# Patient Record
Sex: Male | Born: 1970 | Race: White | Hispanic: No | Marital: Single | Smoking: Current every day smoker
Health system: Southern US, Community
[De-identification: ages and names within clinical notes are randomized; demographics above are authoritative.]

## PROBLEM LIST (undated history)

## (undated) DIAGNOSIS — M1990 Unspecified osteoarthritis, unspecified site: Secondary | ICD-10-CM

## (undated) DIAGNOSIS — F4310 Post-traumatic stress disorder, unspecified: Secondary | ICD-10-CM

## (undated) DIAGNOSIS — F319 Bipolar disorder, unspecified: Secondary | ICD-10-CM

## (undated) DIAGNOSIS — M549 Dorsalgia, unspecified: Secondary | ICD-10-CM

## (undated) DIAGNOSIS — G8929 Other chronic pain: Secondary | ICD-10-CM

## (undated) HISTORY — PX: FRACTURE SURGERY: SHX138

---

## 2014-05-15 ENCOUNTER — Emergency Department (HOSPITAL_COMMUNITY)
Admission: EM | Admit: 2014-05-15 | Discharge: 2014-05-16 | Disposition: A | Discharge status: Home / Self Care | Payer: Self-pay | Attending: Emergency Medicine | Admitting: Emergency Medicine

## 2014-05-15 ENCOUNTER — Encounter (HOSPITAL_COMMUNITY): Payer: Self-pay | Admitting: Emergency Medicine

## 2014-05-15 DIAGNOSIS — Z720 Tobacco use: Secondary | ICD-10-CM | POA: Insufficient documentation

## 2014-05-15 DIAGNOSIS — K8020 Calculus of gallbladder without cholecystitis without obstruction: Secondary | ICD-10-CM | POA: Insufficient documentation

## 2014-05-15 DIAGNOSIS — R1011 Right upper quadrant pain: Secondary | ICD-10-CM

## 2014-05-15 DIAGNOSIS — G8929 Other chronic pain: Secondary | ICD-10-CM | POA: Insufficient documentation

## 2014-05-15 DIAGNOSIS — Z8739 Personal history of other diseases of the musculoskeletal system and connective tissue: Secondary | ICD-10-CM | POA: Insufficient documentation

## 2014-05-15 DIAGNOSIS — Z8659 Personal history of other mental and behavioral disorders: Secondary | ICD-10-CM | POA: Insufficient documentation

## 2014-05-15 HISTORY — DX: Other chronic pain: G89.29

## 2014-05-15 HISTORY — DX: Unspecified osteoarthritis, unspecified site: M19.90

## 2014-05-15 HISTORY — DX: Dorsalgia, unspecified: M54.9

## 2014-05-15 HISTORY — DX: Post-traumatic stress disorder, unspecified: F43.10

## 2014-05-15 HISTORY — DX: Bipolar disorder, unspecified: F31.9

## 2014-05-15 LAB — CBC WITH DIFFERENTIAL/PLATELET
BASOS ABS: 0 10*3/uL (ref 0.0–0.1)
BASOS PCT: 0 % (ref 0–1)
EOS PCT: 1 % (ref 0–5)
Eosinophils Absolute: 0.1 10*3/uL (ref 0.0–0.7)
HCT: 47.7 % (ref 39.0–52.0)
Hemoglobin: 16.8 g/dL (ref 13.0–17.0)
LYMPHS PCT: 19 % (ref 12–46)
Lymphs Abs: 1.6 10*3/uL (ref 0.7–4.0)
MCH: 30.3 pg (ref 26.0–34.0)
MCHC: 35.2 g/dL (ref 30.0–36.0)
MCV: 86.1 fL (ref 78.0–100.0)
Monocytes Absolute: 0.5 10*3/uL (ref 0.1–1.0)
Monocytes Relative: 6 % (ref 3–12)
Neutro Abs: 6.3 10*3/uL (ref 1.7–7.7)
Neutrophils Relative %: 74 % (ref 43–77)
PLATELETS: 166 10*3/uL (ref 150–400)
RBC: 5.54 MIL/uL (ref 4.22–5.81)
RDW: 13 % (ref 11.5–15.5)
WBC: 8.6 10*3/uL (ref 4.0–10.5)

## 2014-05-15 LAB — HEPATIC FUNCTION PANEL
ALK PHOS: 70 U/L (ref 39–117)
ALT: 49 U/L (ref 0–53)
AST: 33 U/L (ref 0–37)
Albumin: 4.3 g/dL (ref 3.5–5.2)
BILIRUBIN INDIRECT: 0.4 mg/dL (ref 0.3–0.9)
BILIRUBIN TOTAL: 0.5 mg/dL (ref 0.3–1.2)
Bilirubin, Direct: 0.1 mg/dL (ref 0.0–0.5)
Total Protein: 7.3 g/dL (ref 6.0–8.3)

## 2014-05-15 LAB — LIPASE, BLOOD: LIPASE: 39 U/L (ref 11–59)

## 2014-05-15 LAB — I-STAT CHEM 8, ED
BUN: 12 mg/dL (ref 6–23)
Calcium, Ion: 1.16 mmol/L (ref 1.12–1.23)
Chloride: 104 mmol/L (ref 96–112)
Creatinine, Ser: 1.1 mg/dL (ref 0.50–1.35)
Glucose, Bld: 96 mg/dL (ref 70–99)
HEMATOCRIT: 53 % — AB (ref 39.0–52.0)
Hemoglobin: 18 g/dL — ABNORMAL HIGH (ref 13.0–17.0)
Potassium: 3.7 mmol/L (ref 3.5–5.1)
SODIUM: 142 mmol/L (ref 135–145)
TCO2: 19 mmol/L (ref 0–100)

## 2014-05-15 MED ORDER — MORPHINE SULFATE 4 MG/ML IJ SOLN
4.0000 mg | Freq: Once | INTRAMUSCULAR | Status: AC
  Administered 2014-05-15: 4 mg via INTRAVENOUS
  Filled 2014-05-15: qty 1

## 2014-05-15 MED ORDER — ONDANSETRON HCL 4 MG/2ML IJ SOLN
4.0000 mg | Freq: Once | INTRAMUSCULAR | Status: AC
  Administered 2014-05-15: 4 mg via INTRAVENOUS
  Filled 2014-05-15: qty 2

## 2014-05-15 NOTE — ED Notes (Signed)
Pt c/o RUQ pain for past 24 hours, nausea without vomiting.; Loose bowels but no diarrhea

## 2014-05-15 NOTE — ED Provider Notes (Signed)
CSN: [id number]     Arrival date & time 05/15/14  1948 History  This chart was scribed for Joya Gaskinsonald W Ayren Zumbro, MD by Richarda Overlieichard Holland, ED Scribe. This patient was seen in room B16C/B16C and the patient's care was started 11:13 PM.   Chief Complaint  Patient presents with  . Abdominal Pain   Patient is a 44 y.o. male presenting with abdominal pain. The history is provided by the patient. No language interpreter was used.  Abdominal Pain Pain location:  RUQ Pain quality: aching and sharp   Pain radiates to:  Does not radiate Pain severity:  Mild Onset quality:  Unable to specify Duration:  24 hours Timing:  Unable to specify Associated symptoms: nausea   Associated symptoms: no diarrhea and no vomiting    HPI Comments: Justin Khan is a 44 y.o. male who presents to the Emergency Department complaining of constant, RUQ abdominal pain that started 24 hours ago. Pt describes the pain as poking and twisting. He reports associated nausea and says he has a metallic taste in his mouth. Pt reports that his pain at this time is a "dull ache." He says that when [place] told him his enzymes were elevated. He states that he had a small amount of alcohol last night. Pt says he uses ibuprofen occasionally for his scoliosis. He says the last time he ate was 2PM today. Pt denies any previous abdominal surgeries. He denies fever, CP, SOB, cough, diarrhea, blood in his stool and dysuria. Pt reports NKDA.   Past Medical History  Diagnosis Date  . Osteoarthritis   . Chronic back pain   . Bipolar 1 disorder   . PTSD (post-traumatic stress disorder)    Past Surgical History  Procedure Laterality Date  . Fracture surgery     No family history on file. History  Substance Use Topics  . Smoking status: Current Every Day Smoker  . Smokeless tobacco: Not on file  . Alcohol Use: Yes     Comment: occasional    Review of Systems  Gastrointestinal: Positive for nausea and  abdominal pain. Negative for vomiting, diarrhea and blood in stool.  All other systems reviewed and are negative.   Allergies  Review of patient's allergies indicates no known allergies.  Home Medications   Prior to Admission medications   Not on File   BP 133/81 mmHg  Pulse 77  Temp(Src) 97.7 F (36.5 C) (Oral)  Resp 16  Ht 6\' 1"  (1.854 m)  Wt 220 lb 9.6 oz (100.064 kg)  BMI 29.11 kg/m2  SpO2 98% Physical Exam  Nursing note and vitals reviewed.  CONSTITUTIONAL: Well developed/well nourished HEAD: Normocephalic/atraumatic EYES: EOMI/PERRL ENMT: Mucous membranes moist NECK: supple no meningeal signs SPINE/BACK:entire spine nontender CV: S1/S2 noted, no murmurs/rubs/gallops noted LUNGS: Lungs are clear to auscultation bilaterally, no apparent distress ABDOMEN: soft, moderate RUQ tenderness, no rebound or guarding, bowel sounds noted throughout abdomen GU:no cva tenderness NEURO: Pt is awake/alert/appropriate, moves all extremitiesx4.  No facial droop.   EXTREMITIES: pulses normal/equal, full ROM SKIN: warm, color normal PSYCH: no abnormalities of mood noted, alert and oriented to situation  ED Course  Procedures   DIAGNOSTIC STUDIES: Oxygen Saturation is 98% on RA, normal by my interpretation.    COORDINATION OF CARE: 11:15 PM Discussed treatment plan with pt at bedside and pt agreed to plan. 12:28 AM Pt feeling improved, awaiting US  At time of discharge, pt felt improved Advised need for surgery followup Discussed  appropriate dietary precautions for cholelithiasis   Labs Review Labs Reviewed  I-STAT CHEM 8, ED - Abnormal; Notable for the following:    Hemoglobin 18.0 (*)    HCT 53.0 (*)    All other components within normal limits  CBC WITH DIFFERENTIAL/PLATELET  HEPATIC FUNCTION PANEL  LIPASE, BLOOD    Imaging Review US Abdomen Complete  05/16/2014   CLINICAL DATA:  Acute onset of upper quadrant abdominal pain. Initial encounter.  EXAM: ULTRASOUND  ABDOMEN COMPLETE  COMPARISON:  None.  FINDINGS: Gallbladder: A single 1.6 cm nonmobile stone is noted at the base of the gallbladder. The gallbladder is otherwise unremarkable in appearance. There is no evidence for gallbladder wall thickening or pericholecystic fluid. No ultrasonographic Murphy's sign is elicited.  Common bile duct: Diameter: 0.4 cm, within normal limits.  Liver: No focal lesion identified. Parenchymal echogenicity within normal limits.  IVC: No abnormality visualized.  Pancreas: Visualized portion unremarkable.  Spleen: Size and appearance within normal limits.  Right Kidney: Length: 11.4 cm. Echogenicity within normal limits. No mass or hydronephrosis visualized.  Left Kidney: Length: 12.7 cm. Echogenicity within normal limits. No mass or hydronephrosis visualized.  Abdominal aorta: No aneurysm visualized.  Other findings: None.  IMPRESSION: 1. Cholelithiasis, with a single nonmobile stone at the base of the gallbladder; gallbladder otherwise unremarkable in appearance. 2. Otherwise unremarkable abdominal ultrasound.   Electronically Signed   By: Roanna Raider M.D.   On: 05/16/2014 02:49   Medications  morphine 4 MG/ML injection 4 mg (4 mg Intravenous Given 05/15/14 2333)  ondansetron (ZOFRAN) injection 4 mg (4 mg Intravenous Given 05/15/14 2333)  morphine 4 MG/ML injection 4 mg (4 mg Intravenous Given 05/16/14 0210)     MDM   Final diagnoses:  RUQ abdominal pain  Calculus of gallbladder without cholecystitis without obstruction    Nursing notes including past medical history and social history reviewed and considered in documentation Labs/vital reviewed myself and considered during evaluation   I personally performed the services described in this documentation, which was scribed in my presence. The recorded information has been reviewed and is accurate.      Joya Gaskins, MD 05/16/14 [phone]

## 2014-05-15 NOTE — ED Notes (Signed)
Dr. Wickline at bedside.  

## 2014-05-15 NOTE — ED Notes (Signed)
Pt c/o RUQ pain for the past 24 hours. Reports nausea, denies emesis. Pt also reports metallic taste in mouth. Tenderness noted to area

## 2014-05-16 ENCOUNTER — Emergency Department (HOSPITAL_COMMUNITY): Payer: Self-pay

## 2014-05-16 MED ORDER — OXYCODONE-ACETAMINOPHEN 5-325 MG PO TABS: 1.0000 | ORAL_TABLET | ORAL | Status: DC | PRN

## 2014-05-16 MED ORDER — MORPHINE SULFATE 4 MG/ML IJ SOLN
4.0000 mg | Freq: Once | INTRAMUSCULAR | Status: AC
  Administered 2014-05-16: 4 mg via INTRAVENOUS
  Filled 2014-05-16: qty 1

## 2014-05-16 NOTE — ED Notes (Signed)
US contacted for delay. Transporter enroute to get pt

## 2014-05-16 NOTE — Discharge Instructions (Signed)
Biliary Colic  °Biliary colic is a steady or irregular pain in the upper abdomen. It is usually under the right side of the rib cage. It happens when gallstones interfere with the normal flow of bile from the gallbladder. Bile is a liquid that helps to digest fats. Bile is made in the liver and stored in the gallbladder. When you eat a meal, bile passes from the gallbladder through the cystic duct and the common bile duct into the small intestine. There, it mixes with partially digested food. If a gallstone blocks either of these ducts, the normal flow of bile is blocked. The muscle cells in the bile duct contract forcefully to try to move the stone. This causes the pain of biliary colic.  °SYMPTOMS  °· A person with biliary colic usually complains of pain in the upper abdomen. This pain can be: °¨ In the center of the upper abdomen just below the breastbone. °¨ In the upper-right part of the abdomen, near the gallbladder and liver. °¨ Spread back toward the right shoulder blade. °· Nausea and vomiting. °· The pain usually occurs after eating. °· Biliary colic is usually triggered by the digestive system's demand for bile. The demand for bile is high after fatty meals. Symptoms can also occur when a person who has been fasting suddenly eats a very large meal. Most episodes of biliary colic pass after 1 to 5 hours. After the most intense pain passes, your abdomen may continue to ache mildly for about 24 hours. °DIAGNOSIS  °After you describe your symptoms, your caregiver will perform a physical exam. He or she will pay attention to the upper right portion of your belly (abdomen). This is the area of your liver and gallbladder. An ultrasound will help your caregiver look for gallstones. Specialized scans of the gallbladder may also be done. Blood tests may be done, especially if you have fever or if your pain persists. °PREVENTION  °Biliary colic can be prevented by controlling the risk factors for gallstones. Some of  these risk factors, such as heredity, increasing age, and pregnancy are a normal part of life. Obesity and a high-fat diet are risk factors you can change through a healthy lifestyle. Women going through menopause who take hormone replacement therapy (estrogen) are also more likely to develop biliary colic. °TREATMENT  °· Pain medication may be prescribed. °· You may be encouraged to eat a fat-free diet. °· If the first episode of biliary colic is severe, or episodes of colic keep retuning, surgery to remove the gallbladder (cholecystectomy) is usually recommended. This procedure can be done through small incisions using an instrument called a laparoscope. The procedure often requires a brief stay in the hospital. Some people can leave the hospital the same day. It is the most widely used treatment in people troubled by painful gallstones. It is effective and safe, with no complications in more than 90% of cases. °· If surgery cannot be done, medication that dissolves gallstones may be used. This medication is expensive and can take months or years to work. Only small stones will dissolve. °· Rarely, medication to dissolve gallstones is combined with a procedure called shock-wave lithotripsy. This procedure uses carefully aimed shock waves to break up gallstones. In many people treated with this procedure, gallstones form again within a few years. °PROGNOSIS  °If gallstones block your cystic duct or common bile duct, you are at risk for repeated episodes of biliary colic. There is also a 25% chance that you will develop   a gallbladder infection(acute cholecystitis), or some other complication of gallstones within 10 to 20 years. If you have surgery, schedule it at a time that is convenient for you and at a time when you are not sick. °HOME CARE INSTRUCTIONS  °· Drink plenty of clear fluids. °· Avoid fatty, greasy or fried foods, or any foods that make your pain worse. °· Take medications as directed. °SEEK MEDICAL  CARE IF:  °· You develop a fever over 100.5° F (38.1° C). °· Your pain gets worse over time. °· You develop nausea that prevents you from eating and drinking. °· You develop vomiting. °SEEK IMMEDIATE MEDICAL CARE IF:  °· You have continuous or severe belly (abdominal) pain which is not relieved with medications. °· You develop nausea and vomiting which is not relieved with medications. °· You have symptoms of biliary colic and you suddenly develop a fever and shaking chills. This may signal cholecystitis. Call your caregiver immediately. °· You develop a yellow color to your skin or the white part of your eyes (jaundice). °Document Released: 08/29/2005 Document Revised: 06/20/2011 Document Reviewed: 11/08/2007 °Exit[employer]® Patient Information ©2015 [employer]. This information is not intended to replace advice given to you by your health care provider. Make sure you discuss any questions you have with your health care provider. ° °

## 2014-07-08 ENCOUNTER — Emergency Department (HOSPITAL_COMMUNITY): Payer: Self-pay

## 2014-07-08 ENCOUNTER — Emergency Department (HOSPITAL_COMMUNITY)
Admission: EM | Admit: 2014-07-08 | Discharge: 2014-07-09 | Disposition: A | Discharge status: Home / Self Care | Payer: Self-pay | Attending: Emergency Medicine | Admitting: Emergency Medicine

## 2014-07-08 ENCOUNTER — Encounter (HOSPITAL_COMMUNITY): Payer: Self-pay | Admitting: Emergency Medicine

## 2014-07-08 DIAGNOSIS — K8020 Calculus of gallbladder without cholecystitis without obstruction: Secondary | ICD-10-CM | POA: Insufficient documentation

## 2014-07-08 DIAGNOSIS — Z8659 Personal history of other mental and behavioral disorders: Secondary | ICD-10-CM | POA: Insufficient documentation

## 2014-07-08 DIAGNOSIS — G8929 Other chronic pain: Secondary | ICD-10-CM | POA: Insufficient documentation

## 2014-07-08 DIAGNOSIS — Z720 Tobacco use: Secondary | ICD-10-CM | POA: Insufficient documentation

## 2014-07-08 DIAGNOSIS — Z8719 Personal history of other diseases of the digestive system: Secondary | ICD-10-CM

## 2014-07-08 DIAGNOSIS — M1990 Unspecified osteoarthritis, unspecified site: Secondary | ICD-10-CM | POA: Insufficient documentation

## 2014-07-08 DIAGNOSIS — R1011 Right upper quadrant pain: Secondary | ICD-10-CM

## 2014-07-08 LAB — CBC WITH DIFFERENTIAL/PLATELET
Basophils Absolute: 0 10*3/uL (ref 0.0–0.1)
Basophils Relative: 0 % (ref 0–1)
EOS ABS: 0.2 10*3/uL (ref 0.0–0.7)
EOS PCT: 2 % (ref 0–5)
HCT: 46 % (ref 39.0–52.0)
Hemoglobin: 16.1 g/dL (ref 13.0–17.0)
LYMPHS ABS: 2 10*3/uL (ref 0.7–4.0)
Lymphocytes Relative: 25 % (ref 12–46)
MCH: 30 pg (ref 26.0–34.0)
MCHC: 35 g/dL (ref 30.0–36.0)
MCV: 85.8 fL (ref 78.0–100.0)
MONO ABS: 0.6 10*3/uL (ref 0.1–1.0)
MONOS PCT: 8 % (ref 3–12)
Neutro Abs: 5.2 10*3/uL (ref 1.7–7.7)
Neutrophils Relative %: 65 % (ref 43–77)
Platelets: 164 10*3/uL (ref 150–400)
RBC: 5.36 MIL/uL (ref 4.22–5.81)
RDW: 13.2 % (ref 11.5–15.5)
WBC: 8.1 10*3/uL (ref 4.0–10.5)

## 2014-07-08 LAB — URINALYSIS, ROUTINE W REFLEX MICROSCOPIC
Bilirubin Urine: NEGATIVE
GLUCOSE, UA: NEGATIVE mg/dL
Hgb urine dipstick: NEGATIVE
Ketones, ur: NEGATIVE mg/dL
LEUKOCYTES UA: NEGATIVE
Nitrite: NEGATIVE
PH: 6 (ref 5.0–8.0)
Protein, ur: NEGATIVE mg/dL
Specific Gravity, Urine: 1.009 (ref 1.005–1.030)
Urobilinogen, UA: 0.2 mg/dL (ref 0.0–1.0)

## 2014-07-08 LAB — COMPREHENSIVE METABOLIC PANEL
ALK PHOS: 66 U/L (ref 39–117)
ALT: 31 U/L (ref 0–53)
AST: 22 U/L (ref 0–37)
Albumin: 4 g/dL (ref 3.5–5.2)
Anion gap: 6 (ref 5–15)
BUN: 13 mg/dL (ref 6–23)
CHLORIDE: 109 mmol/L (ref 96–112)
CO2: 26 mmol/L (ref 19–32)
Calcium: 9.3 mg/dL (ref 8.4–10.5)
Creatinine, Ser: 0.96 mg/dL (ref 0.50–1.35)
GFR calc non Af Amer: >90 mL/min (ref >90)
Glucose, Bld: 91 mg/dL (ref 70–99)
Potassium: 4.2 mmol/L (ref 3.5–5.1)
Sodium: 141 mmol/L (ref 135–145)
TOTAL PROTEIN: 6.8 g/dL (ref 6.0–8.3)
Total Bilirubin: 0.4 mg/dL (ref 0.3–1.2)

## 2014-07-08 LAB — LIPASE, BLOOD: Lipase: 44 U/L (ref 11–59)

## 2014-07-08 MED ORDER — ONDANSETRON HCL 4 MG/2ML IJ SOLN
4.0000 mg | Freq: Once | INTRAMUSCULAR | Status: AC
  Administered 2014-07-08: 4 mg via INTRAVENOUS
  Filled 2014-07-08: qty 2

## 2014-07-08 MED ORDER — MORPHINE SULFATE 4 MG/ML IJ SOLN
4.0000 mg | Freq: Once | INTRAMUSCULAR | Status: AC
  Administered 2014-07-08: 4 mg via INTRAVENOUS
  Filled 2014-07-08: qty 1

## 2014-07-08 MED ORDER — OXYCODONE-ACETAMINOPHEN 5-325 MG PO TABS: 1.0000 | ORAL_TABLET | Freq: Three times a day (TID) | ORAL | Status: DC | PRN

## 2014-07-08 MED ORDER — SODIUM CHLORIDE 0.9 % IV BOLUS (SEPSIS)
1000.0000 mL | Freq: Once | INTRAVENOUS | Status: AC
  Administered 2014-07-08: 1000 mL via INTRAVENOUS

## 2014-07-08 NOTE — ED Notes (Signed)
Report to kevin, rn.  Pt care transferred.

## 2014-07-08 NOTE — ED Notes (Signed)
Pt sts upper abd pain from hx of gallstones; pt sts feels same

## 2014-07-08 NOTE — ED Provider Notes (Signed)
CSN: [id number]     Arrival date & time 07/08/14  1814 History   First MD Initiated Contact with Patient 07/08/14 2017     Chief Complaint  Patient presents with  . Abdominal Pain     (Consider location/radiation/quality/duration/timing/severity/associated sxs/prior Treatment) The history is provided by the patient. No language interpreter was used.  Justin Khan is a 44 year old male with past history of bipolar disorder, PTSD, chronic back pain, osteoarthritis presenting to the ED with abdominal pain that started on Easter Sunday. Patient reported the pain was a sudden onset described as if he was "shot by a gun" localized to the right upper quadrant. Patient reported that the pain started approximately 2 hours after eating ham. Stated that it has been a constant throbbing intermittent stabbing pain. Reported associated nausea. Reported that at times the pain radiates towards his back and left upper quadrant. Stated that he's been using ibuprofen and Tylenol with minimal relief. Stated that he was seen and assessed in ED setting earlier this year where he was diagnosed with gallstones and has not followed up with anyone regarding this. Denied fever, chills, vomiting, diarrhea, melena, hematochezia, back pain, dysuria, decreased urination, dizziness, chest pain, shortness of breath, difficulty breathing, difficulty passing gas. PCP none  Past Medical History  Diagnosis Date  . Osteoarthritis   . Chronic back pain   . Bipolar 1 disorder   . PTSD (post-traumatic stress disorder)    Past Surgical History  Procedure Laterality Date  . Fracture surgery     History reviewed. No pertinent family history. History  Substance Use Topics  . Smoking status: Current Every Day Smoker  . Smokeless tobacco: Not on file  . Alcohol Use: Yes     Comment: occasional    Review of Systems  Constitutional: Negative for fever and chills.  Respiratory: Negative for chest tightness and shortness of  breath.   Cardiovascular: Negative for chest pain.  Gastrointestinal: Positive for nausea and abdominal pain. Negative for vomiting, diarrhea, constipation, blood in stool and anal bleeding.  Genitourinary: Negative for dysuria and decreased urine volume.  Musculoskeletal: Negative for back pain, neck pain and neck stiffness.      Allergies  Review of patient's allergies indicates no known allergies.  Home Medications   Prior to Admission medications   Medication Sig Start Date End Date Taking? Authorizing Provider  oxyCODONE-acetaminophen (PERCOCET/ROXICET) 5-325 MG per tablet Take 1 tablet by mouth every 8 (eight) hours as needed for severe pain. 07/08/14   Jamilett Ferrante, PA-C   BP 109/71 mmHg  Pulse 56  Temp(Src) 98.2 F (36.8 C) (Oral)  Resp 16  SpO2 92% Physical Exam  Constitutional: He is oriented to person, place, and time. He appears well-developed and well-nourished. No distress.  HENT:  Head: Normocephalic and atraumatic.  Eyes: Conjunctivae and EOM are normal. Pupils are equal, round, and reactive to light. Right eye exhibits no discharge. Left eye exhibits no discharge.  Neck: Normal range of motion. Neck supple. No tracheal deviation present.  Cardiovascular: Normal rate, regular rhythm and normal heart sounds.  Exam reveals no friction rub.   No murmur heard. Pulmonary/Chest: Effort normal and breath sounds normal. No respiratory distress. He has no wheezes. He has no rales.  Abdominal: Soft. Bowel sounds are normal. He exhibits no distension. There is tenderness in the right upper quadrant. There is positive Murphy's sign. There is no rebound and no guarding.  Musculoskeletal: Normal range of motion.  Lymphadenopathy:    He has no cervical  adenopathy.  Neurological: He is alert and oriented to person, place, and time. No cranial nerve deficit. He exhibits normal muscle tone. Coordination normal.  Skin: Skin is warm and dry. No rash noted. He is not diaphoretic.  No erythema.  Psychiatric: He has a normal mood and affect. His behavior is normal. Thought content normal.  Nursing note and vitals reviewed.   ED Course  Procedures (including critical care time)   CLINICAL DATA: Acute onset of upper quadrant abdominal pain. Initial encounter.  EXAM: ULTRASOUND ABDOMEN COMPLETE  COMPARISON: None.  FINDINGS: Gallbladder: A single 1.6 cm nonmobile stone is noted at the base of the gallbladder. The gallbladder is otherwise unremarkable in appearance. There is no evidence for gallbladder wall thickening or pericholecystic fluid. No ultrasonographic Murphy's sign is elicited.  Common bile duct: Diameter: 0.4 cm, within normal limits.  Liver: No focal lesion identified. Parenchymal echogenicity within normal limits.  IVC: No abnormality visualized.  Pancreas: Visualized portion unremarkable.  Spleen: Size and appearance within normal limits.  Right Kidney: Length: 11.4 cm. Echogenicity within normal limits. No mass or hydronephrosis visualized.  Left Kidney: Length: 12.7 cm. Echogenicity within normal limits. No mass or hydronephrosis visualized.  Abdominal aorta: No aneurysm visualized.  Other findings: None.  IMPRESSION: 1. Cholelithiasis, with a single nonmobile stone at the base of the gallbladder; gallbladder otherwise unremarkable in appearance. 2. Otherwise unremarkable abdominal ultrasound.   Electronically Signed  By: Roanna Raider M.D.  On: 05/16/2014 02:49   Results for orders placed or performed during the hospital encounter of 07/08/14  CBC with Differential  Result Value Ref Range   WBC 8.1 4.0 - 10.5 K/uL   RBC 5.36 4.22 - 5.81 MIL/uL   Hemoglobin 16.1 13.0 - 17.0 g/dL   HCT 40.9 81.1 - 91.4 %   MCV 85.8 78.0 - 100.0 fL   MCH 30.0 26.0 - 34.0 pg   MCHC 35.0 30.0 - 36.0 g/dL   RDW 78.2 95.6 - 21.3 %   Platelets 164 150 - 400 K/uL   Neutrophils Relative % 65 43 - 77 %   Neutro Abs 5.2  1.7 - 7.7 K/uL   Lymphocytes Relative 25 12 - 46 %   Lymphs Abs 2.0 0.7 - 4.0 K/uL   Monocytes Relative 8 3 - 12 %   Monocytes Absolute 0.6 0.1 - 1.0 K/uL   Eosinophils Relative 2 0 - 5 %   Eosinophils Absolute 0.2 0.0 - 0.7 K/uL   Basophils Relative 0 0 - 1 %   Basophils Absolute 0.0 0.0 - 0.1 K/uL  Comprehensive metabolic panel  Result Value Ref Range   Sodium 141 135 - 145 mmol/L   Potassium 4.2 3.5 - 5.1 mmol/L   Chloride 109 96 - 112 mmol/L   CO2 26 19 - 32 mmol/L   Glucose, Bld 91 70 - 99 mg/dL   BUN 13 6 - 23 mg/dL   Creatinine, Ser 0.86 0.50 - 1.35 mg/dL   Calcium 9.3 8.4 - 57.8 mg/dL   Total Protein 6.8 6.0 - 8.3 g/dL   Albumin 4.0 3.5 - 5.2 g/dL   AST 22 0 - 37 U/L   ALT 31 0 - 53 U/L   Alkaline Phosphatase 66 39 - 117 U/L   Total Bilirubin 0.4 0.3 - 1.2 mg/dL   GFR calc non Af Amer >90 >90 mL/min   GFR calc Af Amer >90 >90 mL/min   Anion gap 6 5 - 15  Lipase, blood  Result Value Ref Range  Lipase 44 11 - 59 U/L  Urinalysis, Routine w reflex microscopic  Result Value Ref Range   Color, Urine YELLOW YELLOW   APPearance HAZY (A) CLEAR   Specific Gravity, Urine 1.009 1.005 - 1.030   pH 6.0 5.0 - 8.0   Glucose, UA NEGATIVE NEGATIVE mg/dL   Hgb urine dipstick NEGATIVE NEGATIVE   Bilirubin Urine NEGATIVE NEGATIVE   Ketones, ur NEGATIVE NEGATIVE mg/dL   Protein, ur NEGATIVE NEGATIVE mg/dL   Urobilinogen, UA 0.2 0.0 - 1.0 mg/dL   Nitrite NEGATIVE NEGATIVE   Leukocytes, UA NEGATIVE NEGATIVE    Labs Review Labs Reviewed  URINALYSIS, ROUTINE W REFLEX MICROSCOPIC - Abnormal; Notable for the following:    APPearance HAZY (*)    All other components within normal limits  CBC WITH DIFFERENTIAL/PLATELET  COMPREHENSIVE METABOLIC PANEL  LIPASE, BLOOD    Imaging Review US Abdomen Limited Ruq  07/08/2014   CLINICAL DATA:  Acute onset of right upper quadrant abdominal pain. Initial encounter.  EXAM: US ABDOMEN LIMITED - RIGHT UPPER QUADRANT  COMPARISON:  Abdominal  ultrasound performed 05/16/2014  FINDINGS: Gallbladder:  A single 1.9 cm nonmobile stone is noted dependently in the gallbladder. The gallbladder is otherwise unremarkable in appearance. No gallbladder wall thickening or pericholecystic fluid is seen. No ultrasonographic Murphy's sign is elicited.  Common bile duct:  Diameter: 0.5 cm, within normal limits in caliber.  Liver:  No focal lesion identified. Within normal limits in parenchymal echogenicity.  IMPRESSION: Cholelithiasis; gallbladder otherwise unremarkable in appearance. No acute abnormality seen in the right upper quadrant.   Electronically Signed   By: Roanna Raider M.D.   On: 07/08/2014 22:41     EKG Interpretation None      MDM   Final diagnoses:  Calculus of gallbladder without cholecystitis without obstruction  Right upper quadrant pain    Medications  sodium chloride 0.9 % bolus 1,000 mL (0 mLs Intravenous Stopped 07/08/14 2320)  morphine 4 MG/ML injection 4 mg (4 mg Intravenous Given 07/08/14 2147)  ondansetron (ZOFRAN) injection 4 mg (4 mg Intravenous Given 07/08/14 2147)  morphine 4 MG/ML injection 4 mg (4 mg Intravenous Given 07/08/14 2340)    Filed Vitals:   07/08/14 2300 07/08/14 2319 07/08/14 2330 07/09/14 0000  BP: 129/79 129/79 115/82 109/71  Pulse: 67 60 70 56  Temp:      TempSrc:      Resp:  16    SpO2: 93% 97% 96% 92%   This provider reviewed the patient's chart. Patient was seen and assessed in ED setting on 05/15/2014 regarding right upper quadrant pain. Patient was diagnosed with cholelithiasis, single non-mobile stone noted at the base of the gallbladder.  CBC negative elevated leukocytosis. Hemoglobin 16.1, hematocrit 46.0. CMP unremarkable-negative elevated liver enzymes. Negative elevated bilirubin. Lipase negative elevation. Urinalysis negative for hemoglobin, nitrites, leukocytes-negative findings of infection. Abdominal ultrasound noted cholelithiasis - gallbladder otherwise unremarkable-no findings  of acute abnormalities of the right upper quadrant. [place] negative for acute cholecystitis or cholangitis. Pain controlled in the ED setting with IV medications. Negative findings of infection. Patient stable, afebrile. Patient not septic appearing. Negative signs of respiratory distress. Discharged patient. [facility] [place] [facility]. Discussed with patient to rest and stay hydrated. Discussed with patient proper diet. Discussed with patient to closely monitor symptoms and if symptoms are to worsen or change to report back to the ED - strict return instructions given.  Patient agreed to plan of care, understood, all questions  answered.   Raymon Mutton, PA-C 07/09/14 1610  Doug Sou, MD 07/09/14 [phone]

## 2014-07-09 NOTE — Discharge Instructions (Signed)
Please call your doctor for a followup appointment within 24-48 hours. When you talk to your doctor please let them know that you were seen in the emergency department and have them acquire all of your records so that they can discuss the findings with you and formulate a treatment plan to fully care for your new and ongoing problems. Please call and set-up an appointment with [facility]  Please call and set-up an appointment with [facility]  Please rest and stay hydrated Please avoid food that is high in fat, grease, oil, cholesterol  Please drink plenty of water Please take medications as prescribed - while on pain medications there is to be no drinking alcohol, driving, operating any heavy machinery. If extra please dispose in a proper manner. Please do not take any extra Tylenol with this medication for this can lead to Tylenol overdose and liver issues.  Please continue to monitor symptoms closely and if symptoms are to worsen or change (fever greater than 101, chills, sweating, nausea, vomiting, chest pain, shortness of breathe, difficulty breathing, weakness, numbness, tingling, worsening or changes to pain pattern, worsening or changes to right upper quadrant pain, inability keep food or fluids down, blood in stools, black tarry stools, back pain, changes to skin colored such as yellowing, altered mental status, changes to mentation, confusion, disorientation) please report back to the Emergency Department immediately.    Cholelithiasis Cholelithiasis (also called gallstones) is a form of gallbladder disease in which gallstones form in your gallbladder. The gallbladder is an organ that stores bile made in the liver, which helps digest fats. Gallstones begin as small crystals and slowly grow into stones. Gallstone pain occurs when the gallbladder spasms and a gallstone is blocking the duct. Pain can also occur when a stone passes out of the duct.  RISK  FACTORS  Being male.   Having multiple pregnancies. Health care providers sometimes advise removing diseased gallbladders before future pregnancies.   Being obese.  Eating a diet heavy in fried foods and fat.   Being older than 60 years and increasing age.   Prolonged use of medicines containing male hormones.   Having diabetes mellitus.   Rapidly losing weight.   Having a family history of gallstones (heredity).  SYMPTOMS  Nausea.   Vomiting.  Abdominal pain.   Yellowing of the skin (jaundice).   Sudden pain. It may persist from several minutes to several hours.  Fever.   Tenderness to the touch. In some cases, when gallstones do not move into the bile duct, people have no pain or symptoms. These are called "silent" gallstones.  TREATMENT Silent gallstones do not need treatment. In severe cases, emergency surgery may be required. Options for treatment include:  Surgery to remove the gallbladder. This is the most common treatment.  Medicines. These do not always work and may take 6-12 months or more to work.  Shock wave treatment (extracorporeal biliary lithotripsy). In this treatment an ultrasound machine sends shock waves to the gallbladder to break gallstones into smaller pieces that can pass into the intestines or be dissolved by medicine. HOME CARE INSTRUCTIONS   Only take over-the-counter or prescription medicines for pain, discomfort, or fever as directed by your health care provider.   Follow a low-fat diet until seen again by your health care provider. Fat causes the gallbladder to contract, which can result in pain.   Follow up with your health care provider as directed. Attacks are almost always recurrent and surgery is usually  required for permanent treatment.  SEEK IMMEDIATE MEDICAL CARE IF:   Your pain increases and is not controlled by medicines.   You have a fever or persistent symptoms for more than 2-3 days.   You have a  fever and your symptoms suddenly get worse.   You have persistent nausea and vomiting.  MAKE SURE YOU:   Understand these instructions.  Will watch your condition.  Will get help right away if you are not doing well or get worse. Document Released: 03/24/2005 Document Revised: 11/28/2012 Document Reviewed: 09/19/2012 [facility] Patient Information 2015 [place], [place]. This information is not intended to replace advice given to you by your health care provider. Make sure you discuss any questions you have with your health care provider.   Emergency Department Resource Guide 1) Find a Doctor and Pay Out of Pocket Although you won't have to find out who is covered by your insurance plan, it is a good idea to ask around and get recommendations. You will then need to call the office and see if the doctor you have chosen will accept you as a new patient and what types of options they offer for patients who are self-pay. Some doctors offer discounts or will set up payment plans for their patients who do not have insurance, but you will need to ask so you aren't surprised when you get to your appointment.  2) Contact Your Local Health Department Not all health departments have doctors that can see patients for sick visits, but many do, so it is worth a call to see if yours does. If you don't know where your local health department is, you can check in your phone book. The CDC also has a tool to help you locate your state's health department, and many state websites also have listings of all of their local health departments.  3) Find a Walk-in Clinic If your illness is not likely to be very severe or complicated, you may want to try a walk in clinic. These are popping up all over the country in pharmacies, drugstores, and shopping centers. They're usually staffed by nurse practitioners or physician assistants that have been trained to treat common illnesses and complaints. They're usually fairly quick  and inexpensive. However, if you have serious medical issues or chronic medical problems, these are probably not your best option.  No Primary Care Doctor: - Call Health Connect at  [phone] - they can help you locate a primary care doctor that  accepts your insurance, provides certain services, etc. - Physician Referral Service- [phone]  Chronic Pain Problems: Organization         Address  Phone   Notes  [facility]  [phone] Patients need to be referred by their primary care doctor.   Medication Assistance: Organization         Address  Phone   Notes  [facility] [address] [place], [place] [address] [phone] --Must be a resident of [facility] -- Must have NO insurance coverage whatsoever (no Medicaid/ Medicare, etc.) -- The pt. MUST have a primary care doctor that directs their care regularly and follows them in the community   [facility]  [phone]   [employer]  [phone]    Agencies that provide inexpensive medical care: Organization         Address  Phone   Notes  [facility]  [phone]   [facility]    [phone]   [facility] [address] [place], [place] [id number] [phone]) [phone]   [facility] [address], [place] [phone]   [facility]anned Parenthood    [phone]) [phone]   [facility]    [phone]) [phone]   [facility]  [address], [place] Phone:  [phone], Fax:  [phone] Hours of Operation:  9 am - 6 pm, M-F.  Also accepts Medicaid/Medicare and self-pay.  [facility]  [address], [place] Phone: [phone](336) [phone], Fax: [phone]) [phone]. Hours of Operation:  8:30 am - 5:30 pm, M-F.  Also accepts Medicaid and self-pay.  [facility] [place] [address], [place] Phone: [phone](336) [phone]    [facility] [address], [place], [place] [phone], Ext. [phone] Mondays & Thursdays: 7-9 AM.  First 15 patients are seen on a first come, first serve basis.    Medicaid-accepting [facility] Providers:  Organization         Address  Phone   Notes  [facility] [address], [place] [phone]) [phone] Also accepts self-pay patients.  [facility] [address], [place]  [phone]) [phone]   [facility] [address], [place] [phone]) [phone]   [facility] [address] [place] [address], [place] [phone]) [phone]   Renaye RakersVeita Bland [address], [place]   [phone] Only accepts [place] Access [place] patients after they have their name applied to their card.   Self-Pay (no insurance) in [facility]:  Organization         Address  Phone   Notes  Sickle Cell Patients, [facility] [address], [place] [phone]) [phone]   [facility] [address], [place] [phone]) [phone]   [facility]  [address], [place] [phone]   [facility]/Dr. Osei-Bonsu  [address] [place] [address], [place] or [id number], [place] [phone]) [phone] Phone number for both [place] and [place] locations is the same.  [facility] [address], [place] [phone]) [phone]   [facility] [address] [place] [address] [place] [address], [place] or [address] [phone]   [facility] [address], [place] [phone]) [phone], phone; [phone], fax Sees patients 1st and 3rd Saturday of every month.  Must not qualify for public or private insurance (i.e. Medicaid, Medicare, [place] Health Choice, Veterans' Benefits)  Household income should be no more than 200% of the poverty level The clinic cannot treat you if you are  pregnant or think you are pregnant  Sexually transmitted diseases are not treated at the clinic.    Dental Care: Organization         Address  Phone  Notes  [facility] of [facility] [address], [place] [phone] Accepts children up to age 42 who are enrolled in [place] or [place] Health Choice; pregnant women with a Medicaid card; and children who have applied for Medicaid or [place] Health Choice, but were declined, whose parents can pay a reduced fee at time of service.  [facility] of [facility] [place]  [address], [place] [phone] Accepts children up to age 75 who are enrolled in Medicaid or [place] Health Choice; pregnant women with a Medicaid card; and children who have applied for Medicaid or [place] Health Choice, but were declined, whose parents can pay a reduced fee at time of service.  [facility]  [address] [place], [place] [phone] Patients are seen by appointment only. Walk-ins are not accepted. [facility] will see patients 44 years of age and older. Monday - Tuesday (8am-5pm) Most Wednesdays (8:30-5pm) $30 per visit, cash only  [facility]  [address] [place], [facility] [phone] Patients are seen by appointment only. Walk-ins are not accepted. [facility] will see patients 84 years of age and older. One Wednesday Evening (Monthly: Volunteer Based).  $30 per visit, cash only  [employer]  [phone] for adults; Children under age 68, call Graduate Pediatric Dentistry at [phone]. Children aged 19-14, please call [phone] to request a pediatric application.  Dental services are provided in all areas of dental care including fillings, crowns and bridges, complete and partial dentures, implants, gum treatment, root canals, and extractions. Preventive care is also provided. Treatment is provided to  both adults and children. Patients are selected via a lottery and there is often a waiting list.   [facility] [address] [place]  [phone] [email or web address]   [facility] [address] [place], [place] [phone], Ext. [phone] Second and Fourth Thursday of each month, opens at 6:30 AM; Clinic ends at 9 AM.  Patients are seen on a first-come first-served basis, and a limited number are seen during each clinic.   [facility]  [address] [place], [place] [phone]   Eligibility Requirements You must have lived in [place], [place], or [place] counties for at least the last three months.   You cannot be eligible for state or federal sponsored [place], including [employer], [place], or [employer].   You generally cannot be eligible for healthcare insurance through your employer.    How to apply: Eligibility screenings are held every Tuesday and Wednesday afternoon from 1:00 pm until 4:00 pm. You do not need an appointment for the interview!  [facility] [address], [place], [place] [phone]   [facility] Health Department  [phone]   [facility] Health Department  [phone]   [facility] Health Department  [phone]    Behavioral Health Resources in the Community: Intensive Outpatient Programs Organization         Address  Phone  Notes  [facility] [address] [place], [place] [phone]   [facility] Outpatient [address] [place], [place] [phone]   [facility] [address] [place], [place]  [phone]   [facility] [address],  [place], [place] [phone] or [phone]   Substance Abuse Resources Organization         Address  Phone  Notes  [facility]  [phone]   [facility]  [phone]   [facility] [place]   [phone]   Floydene Flock  [phone]   Residential & Outpatient Substance Abuse Program  [phone]   Psychological Services Organization         Address  Phone  Notes  [facility]  [phone]   [facility]  [phone]  [facility] [address], [place] [phone] or [phone]    Mobile Crisis Teams Organization         Address  Phone  Notes  [facility], Mobile Crisis Care Unit  [phone]   [facility]  [address] [place], [place] [phone]   Doristine Locks [address] [place] [phone]    Self-Help/Support Groups Organization         Address  Phone             Notes  [facility] [place] - variety of support groups  [phone]- [phone] Call for more information  Narcotics Anonymous (NA), [facility] [address], [employer]  2 meetings at this location   Statistician         Address  Phone  Notes  [facility] [address],    [place]  [phone]   [facility]  [address] [place] [address], [place], [place] [phone]   [facility] [address] [place], [place] [phone] Admissions: 8am-3pm M-F  [facility] [address],    [place], [place] [phone]   [facility] [address] [place], [place], [place] [phone]   [facility] [address]  [place], [place] [phone]   [facility] - Intensive Outpatient [address], [place], [place] [phone]   [facility] ([facility] [address] [place]  [place], [place] [phone] or [phone]   [facility] ([facility]) [address] [place], [place] [phone] Accepts Medicaid  [facility] [address]  [place] [phone] Substance Abuse/Addiction Treatment   [facility] Organization         Address  Phone  Notes  [facility]  [phone]   Angie Fava, PhD [address]vin Kn[address] [place], [place]   [phone])[phone] or ([phone]   [facility]   [address] [place], [place] [phone]   [facility] [address], [place], [place] [phone] Insurance/Medicaid/sponsorship through [facility] and Families [address]                                    [place], [place] [phone] Therapy/tele-psych/case  [facility] [address], [place] [phone]    Dr. Lolly Mustache  [phone]   [facility]  [employer] [facility] Dept. 1) [address] [place] 2) [address], [place] 3)  [address], [place] [phone]  [phone]   [facility] Child Abuse Hotline 931-621-0961 or 9097250243 (After Hours)

## 2014-07-09 NOTE — ED Notes (Signed)
Pt verbalized understanding of d/c instructions and has no further quedstions. D/c home with sister driving.

## 2014-12-16 ENCOUNTER — Emergency Department (HOSPITAL_COMMUNITY)
Admission: EM | Admit: 2014-12-16 | Discharge: 2014-12-16 | Disposition: A | Discharge status: Home / Self Care | Payer: Self-pay | Attending: Emergency Medicine | Admitting: Emergency Medicine

## 2014-12-16 ENCOUNTER — Emergency Department (HOSPITAL_COMMUNITY): Payer: Self-pay

## 2014-12-16 ENCOUNTER — Encounter (HOSPITAL_COMMUNITY): Payer: Self-pay | Admitting: Emergency Medicine

## 2014-12-16 DIAGNOSIS — Y9289 Other specified places as the place of occurrence of the external cause: Secondary | ICD-10-CM | POA: Insufficient documentation

## 2014-12-16 DIAGNOSIS — M5432 Sciatica, left side: Secondary | ICD-10-CM | POA: Insufficient documentation

## 2014-12-16 DIAGNOSIS — F319 Bipolar disorder, unspecified: Secondary | ICD-10-CM | POA: Insufficient documentation

## 2014-12-16 DIAGNOSIS — G8929 Other chronic pain: Secondary | ICD-10-CM | POA: Insufficient documentation

## 2014-12-16 DIAGNOSIS — Z720 Tobacco use: Secondary | ICD-10-CM | POA: Insufficient documentation

## 2014-12-16 DIAGNOSIS — W1839XA Other fall on same level, initial encounter: Secondary | ICD-10-CM | POA: Insufficient documentation

## 2014-12-16 DIAGNOSIS — Y9389 Activity, other specified: Secondary | ICD-10-CM | POA: Insufficient documentation

## 2014-12-16 DIAGNOSIS — S63501A Unspecified sprain of right wrist, initial encounter: Secondary | ICD-10-CM | POA: Insufficient documentation

## 2014-12-16 DIAGNOSIS — Y998 Other external cause status: Secondary | ICD-10-CM | POA: Insufficient documentation

## 2014-12-16 MED ORDER — METHOCARBAMOL 500 MG PO TABS: 1000.0000 mg | ORAL_TABLET | Freq: Three times a day (TID) | ORAL | Status: DC | PRN

## 2014-12-16 MED ORDER — IBUPROFEN 400 MG PO TABS
600.0000 mg | ORAL_TABLET | Freq: Once | ORAL | Status: AC
  Administered 2014-12-16: 600 mg via ORAL
  Filled 2014-12-16 (×2): qty 1

## 2014-12-16 MED ORDER — METHOCARBAMOL 500 MG PO TABS
1000.0000 mg | ORAL_TABLET | Freq: Once | ORAL | Status: AC
  Administered 2014-12-16: 1000 mg via ORAL
  Filled 2014-12-16: qty 2

## 2014-12-16 MED ORDER — IBUPROFEN 600 MG PO TABS: 600.0000 mg | ORAL_TABLET | Freq: Four times a day (QID) | ORAL | Status: DC | PRN

## 2014-12-16 NOTE — ED Notes (Signed)
Pt. reports low back pain radiating to legs  and right wrist pain after a fall last night , no LOC / ambulatory , pain increases with movement .

## 2014-12-16 NOTE — Discharge Instructions (Signed)
Sciatica °Sciatica is pain, weakness, numbness, or tingling along the path of the sciatic nerve. The nerve starts in the lower back and runs down the back of each leg. The nerve controls the muscles in the lower leg and in the back of the knee, while also providing sensation to the back of the thigh, lower leg, and the sole of your foot. Sciatica is a symptom of another medical condition. For instance, nerve damage or certain conditions, such as a herniated disk or bone spur on the spine, pinch or put pressure on the sciatic nerve. This causes the pain, weakness, or other sensations normally associated with sciatica. Generally, sciatica only affects one side of the body. °CAUSES  °· Herniated or slipped disc. °· Degenerative disk disease. °· A pain disorder involving the narrow muscle in the buttocks (piriformis syndrome). °· Pelvic injury or fracture. °· Pregnancy. °· Tumor (rare). °SYMPTOMS  °Symptoms can vary from mild to very severe. The symptoms usually travel from the low back to the buttocks and down the back of the leg. Symptoms can include: °· Mild tingling or dull aches in the lower back, leg, or hip. °· Numbness in the back of the calf or sole of the foot. °· Burning sensations in the lower back, leg, or hip. °· Sharp pains in the lower back, leg, or hip. °· Leg weakness. °· Severe back pain inhibiting movement. °These symptoms may get worse with coughing, sneezing, laughing, or prolonged sitting or standing. Also, being overweight may worsen symptoms. °DIAGNOSIS  °Your caregiver will perform a physical exam to look for common symptoms of sciatica. He or she may ask you to do certain movements or activities that would trigger sciatic nerve pain. Other tests may be performed to find the cause of the sciatica. These may include: °· Blood tests. °· X-rays. °· Imaging tests, such as an MRI or CT scan. °TREATMENT  °Treatment is directed at the cause of the sciatic pain. Sometimes, treatment is not necessary  and the pain and discomfort goes away on its own. If treatment is needed, your caregiver may suggest: °· Over-the-counter medicines to relieve pain. °· Prescription medicines, such as anti-inflammatory medicine, muscle relaxants, or narcotics. °· Applying heat or ice to the painful area. °· Steroid injections to lessen pain, irritation, and inflammation around the nerve. °· Reducing activity during periods of pain. °· Exercising and stretching to strengthen your abdomen and improve flexibility of your spine. Your caregiver may suggest losing weight if the extra weight makes the back pain worse. °· Physical therapy. °· Surgery to eliminate what is pressing or pinching the nerve, such as a bone spur or part of a herniated disk. °HOME CARE INSTRUCTIONS  °· Only take over-the-counter or prescription medicines for pain or discomfort as directed by your caregiver. °· Apply ice to the affected area for 20 minutes, 3-4 times a day for the first 48-72 hours. Then try heat in the same way. °· Exercise, stretch, or perform your usual activities if these do not aggravate your pain. °· Attend physical therapy sessions as directed by your caregiver. °· Keep all follow-up appointments as directed by your caregiver. °· Do not wear high heels or shoes that do not provide proper support. °· Check your mattress to see if it is too soft. A firm mattress may lessen your pain and discomfort. °SEEK IMMEDIATE MEDICAL CARE IF:  °· You lose control of your bowel or bladder (incontinence). °· You have increasing weakness in the lower back, pelvis, buttocks,   or legs.  You have redness or swelling of your back.  You have a burning sensation when you urinate.  You have pain that gets worse when you lie down or awakens you at night.  Your pain is worse than you have experienced in the past.  Your pain is lasting longer than 4 weeks.  You are suddenly losing weight without reason. MAKE SURE YOU:  Understand these  instructions.  Will watch your condition.  Will get help right away if you are not doing well or get worse. Document Released: 03/22/2001 Document Revised: 09/27/2011 Document Reviewed: 08/07/2011 [facility] Patient Information 2015 [place], [place]. This information is not intended to replace advice given to you by your health care provider. Make sure you discuss any questions you have with your health care provider.  Wrist Pain Wrist injuries are frequent in adults and children. A sprain is an injury to the ligaments that hold your bones together. A strain is an injury to muscle or muscle cord-like structures (tendons) from stretching or pulling. Generally, when wrists are moderately tender to touch following a fall or injury, a break in the bone (fracture) may be present. Most wrist sprains or strains are better in 3 to 5 days, but complete healing may take several weeks. HOME CARE INSTRUCTIONS   Put ice on the injured area.  Put ice in a plastic bag.  Place a towel between your skin and the bag.  Leave the ice on for 15-20 minutes, 3-4 times a day, for the first 2 days, or as directed by your health care provider.  Keep your arm raised above the level of your heart whenever possible to reduce swelling and pain.  Rest the injured area for at least 48 hours or as directed by your health care provider.  If a splint or elastic bandage has been applied, use it for as long as directed by your health care provider or until seen by a health care provider for a follow-up exam.  Only take over-the-counter or prescription medicines for pain, discomfort, or fever as directed by your health care provider.  Keep all follow-up appointments. You may need to follow up with a specialist or have follow-up X-rays. Improvement in pain level is not a guarantee that you did not fracture a bone in your wrist. The only way to determine whether or not you have a broken bone is by X-ray. SEEK IMMEDIATE MEDICAL  CARE IF:   Your fingers are swollen, very red, white, or cold and blue.  Your fingers are numb or tingling.  You have increasing pain.  You have difficulty moving your fingers. MAKE SURE YOU:   Understand these instructions.  Will watch your condition.  Will get help right away if you are not doing well or get worse. Document Released: 01/05/2005 Document Revised: 04/02/2013 Document Reviewed: 05/19/2010 [facility] Patient Information 2015 [place], [place]. This information is not intended to replace advice given to you by your health care provider. Make sure you discuss any questions you have with your health care provider.

## 2014-12-16 NOTE — ED Provider Notes (Signed)
CSN: [id number]     Arrival date & time 12/16/14  0210 History  This chart was scribed for Justin Racer, MD by Phillis Haggis, ED Scribe. This patient was seen in room B19C/B19C and patient care was started at 3:10 AM.   Chief Complaint  Patient presents with  . Back Pain  . Wrist Pain   The history is provided by the patient. No language interpreter was used.  HPI Comments: Justin Khan is a 44 y.o. Male with hx of chronic back pain and osteoarthritis who presents to the Emergency Department complaining of low back pain radiating down his left leg and right wrist pain onset one day ago. Pt states that he fell last night then began to experience back pain shooting down his legs. He states that he had injured his wrist while moving a refrigerator before the fall but fell on his wrist, worsening the pain. Reports associated swelling to the right wrist. Denies hitting head, neck pain, headache, LOC, bladder or bowel incontinence, or urinary symptoms.   Past Medical History  Diagnosis Date  . Osteoarthritis   . Chronic back pain   . Bipolar 1 disorder   . PTSD (post-traumatic stress disorder)    Past Surgical History  Procedure Laterality Date  . Fracture surgery     No family history on file. Social History  Substance Use Topics  . Smoking status: Current Every Day Smoker  . Smokeless tobacco: None  . Alcohol Use: Yes     Comment: occasional    Review of Systems  Constitutional: Negative for fever and chills.  Respiratory: Negative for shortness of breath.   Cardiovascular: Negative for chest pain.  Gastrointestinal: Negative for nausea, vomiting and abdominal pain.  Genitourinary: Negative for difficulty urinating.  Musculoskeletal: Positive for arthralgias. Negative for neck pain and neck stiffness.  Skin: Negative for rash and wound.  Neurological: Negative for dizziness, weakness, light-headedness, numbness and headaches.  All other systems reviewed and are  negative.     Allergies  Review of patient's allergies indicates no known allergies.  Home Medications   Prior to Admission medications   Medication Sig Start Date End Date Taking? Authorizing Provider  ibuprofen (ADVIL,MOTRIN) 600 MG tablet Take 1 tablet (600 mg total) by mouth every 6 (six) hours as needed for moderate pain. 12/16/14   Justin Racer, MD  methocarbamol (ROBAXIN) 500 MG tablet Take 2 tablets (1,000 mg total) by mouth every 8 (eight) hours as needed. 12/16/14   Justin Racer, MD   BP 118/75 mmHg  Pulse 64  Temp(Src) 98.6 F (37 C) (Oral)  Resp 18  SpO2 96% Physical Exam  Constitutional: He is oriented to person, place, and time. He appears well-developed and well-nourished. No distress.  HENT:  Head: Normocephalic and atraumatic.  Mouth/Throat: Oropharynx is clear and moist.  Eyes: EOM are normal. Pupils are equal, round, and reactive to light.  Neck: Normal range of motion. Neck supple.  Cardiovascular: Normal rate and regular rhythm.   Pulmonary/Chest: Effort normal and breath sounds normal. No respiratory distress. He has no wheezes. He has no rales. He exhibits no tenderness.  Abdominal: Soft. Bowel sounds are normal. He exhibits no distension and no mass. There is no tenderness. There is no rebound and no guarding.  Musculoskeletal: Normal range of motion. He exhibits tenderness (mild tenderness to palpation over the ulnar side of the right wrist. There is no obvious deformity or contusions. Patient has no snuffbox tenderness. Distal pulses intact with good cap refill.). He  exhibits no edema.  Patient has negative straight leg raise. Mild tenderness to palpation along the left paraspinal muscles in the lumbar region. No midline tenderness, step-offs or deformity.  Neurological: He is alert and oriented to person, place, and time.  5/5 motor in all extremities. Sensation is fully intact.  Skin: Skin is warm and dry. No rash noted. No erythema.  Psychiatric: He  has a normal mood and affect. His behavior is normal.  Nursing note and vitals reviewed.   ED Course  Procedures (including critical care time) DIAGNOSTIC STUDIES: Oxygen Saturation is 96% on RA, normal by my interpretation.    COORDINATION OF CARE: 3:12 AM-Discussed treatment plan which includes x-ray of the wrist with pt at bedside and pt agreed to plan.   Labs Review Labs Reviewed - No data to display  Imaging Review Dg Wrist Complete Right  12/16/2014   CLINICAL DATA:  Right wrist pain along the ulnar side after injury tonight.  EXAM: RIGHT WRIST - COMPLETE 3+ VIEW  COMPARISON:  None.  FINDINGS: There is no evidence of fracture or dislocation. There is no evidence of arthropathy or other focal bone abnormality. Soft tissues are unremarkable.  IMPRESSION: Negative.   Electronically Signed   By: Burman Nieves M.D.   On: 12/16/2014 04:10      EKG Interpretation None      MDM   Final diagnoses:  Wrist sprain, right, initial encounter  Sciatica, left   I personally performed the services described in this documentation, which was scribed in my presence. The recorded information has been reviewed and is accurate.   X-ray without any acute deformity. Placed in Velcro splint. Back pain indicative of sciatica. No red flag signs or symptoms. Discharge home with symptomatically controlled. Return precautions given.  Justin Racer, MD 12/17/14 [phone]

## 2015-05-15 ENCOUNTER — Encounter (HOSPITAL_COMMUNITY): Payer: Self-pay | Admitting: Emergency Medicine

## 2015-05-15 ENCOUNTER — Emergency Department (INDEPENDENT_AMBULATORY_CARE_PROVIDER_SITE_OTHER)
Admission: EM | Admit: 2015-05-15 | Discharge: 2015-05-15 | Disposition: A | Discharge status: Home / Self Care | Payer: Commercial Managed Care - PPO | Source: Home / Self Care | Attending: Family Medicine | Admitting: Family Medicine

## 2015-05-15 DIAGNOSIS — K529 Noninfective gastroenteritis and colitis, unspecified: Secondary | ICD-10-CM | POA: Diagnosis not present

## 2015-05-15 MED ORDER — ONDANSETRON HCL 4 MG PO TABS: 4.0000 mg | ORAL_TABLET | Freq: Four times a day (QID) | ORAL | Status: AC

## 2015-05-15 NOTE — Discharge Instructions (Signed)
Diarrhea  Diarrhea is watery poop (stool). It can make you feel weak, tired, thirsty, or give you a dry mouth (signs of dehydration). Watery poop is a sign of another problem, most often an infection. It often lasts 2-3 days. It can last longer if it is a sign of something serious. Take care of yourself as told by your doctor.  HOME CARE   · Drink 1 cup (8 ounces) of fluid each time you have watery poop.  · Do not drink the following fluids:    Those that contain simple sugars (fructose, glucose, galactose, lactose, sucrose, maltose).    Sports drinks.    Fruit juices.    Whole milk products.    Sodas.    Drinks with caffeine (coffee, tea, soda) or alcohol.  · Oral rehydration solution may be used if the doctor says it is okay. You may make your own solution. Follow this recipe:    ?-? teaspoon table salt.    ¾ teaspoon baking soda.    ? teaspoon salt substitute containing potassium chloride.    1 ? tablespoons sugar.    1 liter (34 ounces) of water.  · Avoid the following foods:    High fiber foods, such as raw fruits and vegetables.    Nuts, seeds, and whole grain breads and cereals.     Those that are sweetened with sugar alcohols (xylitol, sorbitol, mannitol).  · Try eating the following foods:    Starchy foods, such as rice, toast, pasta, low-sugar cereal, oatmeal, baked potatoes, crackers, and bagels.    Bananas.    Applesauce.  · Eat probiotic-rich foods, such as yogurt and milk products that are fermented.  · Wash your hands well after each time you have watery poop.  · Only take medicine as told by your doctor.  · Take a warm bath to help lessen burning or pain from having watery poop.  GET HELP RIGHT AWAY IF:   · You cannot drink fluids without throwing up (vomiting).  · You keep throwing up.  · You have blood in your poop, or your poop looks black and tarry.  · You do not pee (urinate) in 6-8 hours, or there is only a small amount of very dark pee.  · You have belly (abdominal) pain that gets worse or  stays in the same spot (localizes).  · You are weak, dizzy, confused, or light-headed.  · You have a very bad headache.  · Your watery poop gets worse or does not get better.  · You have a fever or lasting symptoms for more than 2-3 days.  · You have a fever and your symptoms suddenly get worse.  MAKE SURE YOU:   · Understand these instructions.  · Will watch your condition.  · Will get help right away if you are not doing well or get worse.     This information is not intended to replace advice given to you by your health care provider. Make sure you discuss any questions you have with your health care provider.     Document Released: 09/14/2007 Document Revised: 04/18/2014 Document Reviewed: 12/04/2011  Elsevier Interactive Patient Education ©2016 [employer]

## 2015-05-15 NOTE — ED Provider Notes (Signed)
CSN: [id number]     Arrival date & time 05/15/15  1307 History   First MD Initiated Contact with Patient 05/15/15 1406     Chief Complaint  Patient presents with  . Abdominal Pain   (Consider location/radiation/quality/duration/timing/severity/associated sxs/prior Treatment) HPI Nausea, diarrhea with body aches, low grade temp for 2 days. OTC meds without relief. No blood in diarrhea. 4 loose stools in 24 hours.  Exposed to others at work with similar symptoms.  Past Medical History  Diagnosis Date  . Osteoarthritis   . Chronic back pain   . Bipolar 1 disorder (HCC)   . PTSD (post-traumatic stress disorder)    Past Surgical History  Procedure Laterality Date  . Fracture surgery     No family history on file. Social History  Substance Use Topics  . Smoking status: Current Every Day Smoker -- 1.00 packs/day    Types: Cigarettes  . Smokeless tobacco: None  . Alcohol Use: Yes     Comment: occasional    Review of Systems ROS +'ve diarrhea  Denies: HEADACHE, NAUSEA, ABDOMINAL PAIN, CHEST PAIN, CONGESTION, DYSURIA, SHORTNESS OF BREATH  Allergies  Review of patient's allergies indicates no known allergies.  Home Medications   Prior to Admission medications   Medication Sig Start Date End Date Taking? Authorizing Provider  ibuprofen (ADVIL,MOTRIN) 600 MG tablet Take 1 tablet (600 mg total) by mouth every 6 (six) hours as needed for moderate pain. 12/16/14   Loren Racer, MD  methocarbamol (ROBAXIN) 500 MG tablet Take 2 tablets (1,000 mg total) by mouth every 8 (eight) hours as needed. 12/16/14   Loren Racer, MD   Meds Ordered and Administered this Visit  Medications - No data to display  BP 137/80 mmHg  Pulse 62  Temp(Src) 97.8 F (36.6 C) (Oral)  SpO2 99% No data found.   Physical Exam  Constitutional: He is oriented to person, place, and time. He appears well-developed and well-nourished.  HENT:  Head: Normocephalic and atraumatic.  Right Ear: External ear  normal.  Left Ear: External ear normal.  Mouth/Throat: Oropharynx is clear and moist.  Eyes: Conjunctivae are normal.  Pulmonary/Chest: Effort normal.  Abdominal: Soft. Bowel sounds are normal. He exhibits no distension. There is no tenderness. There is no rebound.  Musculoskeletal: Normal range of motion.  Neurological: He is alert and oriented to person, place, and time.  Skin: Skin is warm and dry.  Psychiatric: He has a normal mood and affect. His behavior is normal.  Nursing note and vitals reviewed.   ED Course  Procedures (including critical care time)  Labs Review Labs Reviewed - No data to display  Imaging Review No results found.   Visual Acuity Review  Right Eye Distance:   Left Eye Distance:   Bilateral Distance:    Right Eye Near:   Left Eye Near:    Bilateral Near:         MDM   1. Enteritis    Patient is advised to continue home symptomatic treatment. Prescription for zofran  sent pharmacy patient has indicated. Patient is advised that if there are new or worsening symptoms or attend the emergency department, or contact primary care provider. Instructions of care provided discharged home in stable condition. Return to work/school note provided.  THIS NOTE WAS GENERATED USING A VOICE RECOGNITION SOFTWARE PROGRAM. ALL REASONABLE EFFORTS  WERE MADE TO PROOFREAD THIS DOCUMENT FOR ACCURACY.     Tharon Aquas, PA 05/15/15 [phone]

## 2015-05-15 NOTE — ED Notes (Signed)
C/o constant abd pain onset 2/1 associated w/diarrhea, HA, BA and feeling nauseas A&O x4... No acute distress.

## 2016-01-08 ENCOUNTER — Emergency Department (HOSPITAL_COMMUNITY)
Admission: EM | Admit: 2016-01-08 | Discharge: 2016-01-08 | Disposition: A | Discharge status: Home / Self Care | Payer: Commercial Managed Care - PPO | Attending: Emergency Medicine | Admitting: Emergency Medicine

## 2016-01-08 ENCOUNTER — Encounter (HOSPITAL_COMMUNITY): Payer: Self-pay | Admitting: *Deleted

## 2016-01-08 DIAGNOSIS — Y939 Activity, unspecified: Secondary | ICD-10-CM | POA: Insufficient documentation

## 2016-01-08 DIAGNOSIS — F17210 Nicotine dependence, cigarettes, uncomplicated: Secondary | ICD-10-CM | POA: Insufficient documentation

## 2016-01-08 DIAGNOSIS — S39012A Strain of muscle, fascia and tendon of lower back, initial encounter: Secondary | ICD-10-CM | POA: Diagnosis not present

## 2016-01-08 DIAGNOSIS — Y92410 Unspecified street and highway as the place of occurrence of the external cause: Secondary | ICD-10-CM | POA: Diagnosis not present

## 2016-01-08 DIAGNOSIS — Y999 Unspecified external cause status: Secondary | ICD-10-CM | POA: Diagnosis not present

## 2016-01-08 DIAGNOSIS — S3992XA Unspecified injury of lower back, initial encounter: Secondary | ICD-10-CM | POA: Diagnosis present

## 2016-01-08 MED ORDER — METHOCARBAMOL 500 MG PO TABS: 1000.0000 mg | ORAL_TABLET | Freq: Three times a day (TID) | ORAL | 0 refills | Status: AC | PRN

## 2016-01-08 MED ORDER — IBUPROFEN 600 MG PO TABS: 600.0000 mg | ORAL_TABLET | Freq: Four times a day (QID) | ORAL | 0 refills | Status: AC | PRN
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
## 2016-01-08 NOTE — ED Notes (Signed)
Pt verbalized understanding of d/c instructions and has no further questions. Pt stable and NAD. Pt ambulatory and declined wheelchair.

## 2016-01-25 ENCOUNTER — Emergency Department (HOSPITAL_COMMUNITY)
Admission: EM | Admit: 2016-01-25 | Discharge: 2016-01-25 | Disposition: A | Discharge status: Home / Self Care | Payer: Commercial Managed Care - PPO | Attending: Emergency Medicine | Admitting: Emergency Medicine

## 2016-01-25 ENCOUNTER — Encounter (HOSPITAL_COMMUNITY): Payer: Self-pay | Admitting: *Deleted

## 2016-01-25 DIAGNOSIS — L237 Allergic contact dermatitis due to plants, except food: Secondary | ICD-10-CM | POA: Insufficient documentation

## 2016-01-25 DIAGNOSIS — F17210 Nicotine dependence, cigarettes, uncomplicated: Secondary | ICD-10-CM | POA: Insufficient documentation

## 2016-01-25 MED ORDER — TRIAMCINOLONE ACETONIDE 0.1 % EX CREA
TOPICAL_CREAM | Freq: Two times a day (BID) | CUTANEOUS | Status: DC
  Administered 2016-01-25: 1 via TOPICAL
  Filled 2016-01-25: qty 15

## 2016-01-25 NOTE — Discharge Instructions (Signed)
Read the information below.  You can apply the steroid cream to affected areas for symptomatic relief. Oatmeal baths can be soothing to skin.  Be sure to wash all clothing that was exposed.  You are not contagious so long as your clothing and exposed areas have been wash thoroughly.  Use the prescribed medication as directed.  Please discuss all new medications with your pharmacist.   Follow up with primary doctor in 2-3 days for re-evaluation.  You may return to the Emergency Department at any time for worsening condition or any new symptoms that concern you. Return to ED if rash spreads, have trouble swallowing, trouble breathing, or any other new/concerning symptoms.

## 2016-01-25 NOTE — ED Provider Notes (Signed)
[facility]-EMERGENCY DEPT Provider Note   CSN: [id number] Arrival date & time: 01/25/16  2055   By signing my name below, I, Clovis PuAvnee Patel, attest that this documentation has been prepared under the direction and in the presence of Arvilla MeresAshley Stassi Fadely, PA-C. Electronically Signed: Clovis PuAvnee Patel, ED Scribe. 01/25/16. 10:50 PM.   History   Chief Complaint Chief Complaint  Patient presents with  . Poison Ivy    The history is provided by the patient. No language interpreter was used.   HPI Comments:  Dortha KernJames Khan is a 45 y.o. male who presents to the Emergency Department complaining of poison ivy to his BUE and chest. Patient notes exposure occurred approximately 4 days ago. Pt states some of the vesicles and bullae have started to weep. He has associated pruritis. He denies fevers, oral lesions, chest tightness, throat swelling, trouble breathing, trouble swallowing, and numbness to his BUE. Pt has been taking benadryl, topical cortisone, and homeopathic herbal lotion with minimal relief. He visited the ED at [facility] on 01/20/16, was given a prescription for steroids but notes he was unable to afford the medication.   Past Medical History:  Diagnosis Date  . Bipolar 1 disorder (HCC)   . Chronic back pain   . Osteoarthritis   . PTSD (post-traumatic stress disorder)     There are no active problems to display for this patient.   Past Surgical History:  Procedure Laterality Date  . FRACTURE SURGERY      Home Medications    Prior to Admission medications   Medication Sig Start Date End Date Taking? Authorizing Provider  ibuprofen (ADVIL,MOTRIN) 600 MG tablet Take 1 tablet (600 mg total) by mouth every 6 (six) hours as needed for moderate pain. 01/08/16   Fayrene HelperBowie Tran, PA-C  methocarbamol (ROBAXIN) 500 MG tablet Take 2 tablets (1,000 mg total) by mouth every 8 (eight) hours as needed. 01/08/16   Fayrene HelperBowie Tran, PA-C  ondansetron (ZOFRAN) 4 MG tablet Take 1 tablet (4 mg total) by mouth every 6  (six) hours. 05/15/15   Tharon AquasFrank C Patrick, PA    Family History No family history on file.  Social History Social History  Substance Use Topics  . Smoking status: Current Every Day Smoker    Packs/day: 1.00    Types: Cigarettes  . Smokeless tobacco: Never Used  . Alcohol use Yes     Comment: occasional     Allergies   Review of patient's allergies indicates no known allergies.   Review of Systems Review of Systems  Constitutional: Negative for fever.  HENT: Negative for mouth sores and trouble swallowing.   Respiratory: Negative for chest tightness and shortness of breath.   Cardiovascular: Negative for chest pain.  Skin: Positive for color change (erythema ) and rash.  Neurological: Negative for numbness.     Physical Exam Updated Vital Signs BP 140/80   Pulse 90   Temp 98 F (36.7 C)   Resp 18   Ht 6\' 1"  (1.854 m)   Wt 97.1 kg   SpO2 98%   BMI 28.23 kg/m   Physical Exam  Constitutional: He is oriented to person, place, and time. He appears well-developed and well-nourished. No distress.  HENT:  Head: Normocephalic and atraumatic.  No oral lesions. No trismus. Uvula midline. Managing oral secretions.   Eyes: Conjunctivae are normal. No scleral icterus.  Neck: Normal range of motion and phonation normal. No neck rigidity. Normal range of motion present.  No stridor. Neck ROM intact.   Cardiovascular:  Normal rate, regular rhythm, normal heart sounds and intact distal pulses.   No murmur heard. Pulmonary/Chest: Effort normal and breath sounds normal. No stridor. No respiratory distress. He has no wheezes. He has no rales.  Abdominal: He exhibits no distension.  Neurological: He is alert and oriented to person, place, and time.  Skin: Skin is warm and dry. Rash noted. He is not diaphoretic. There is erythema.  Erythematous papules, plaques, vesicles, and bullae noted to forearms bilaterally - some in plaques, others in linear configuration. No purulent discharge  noted. Neurovascularly intact.   Psychiatric: He has a normal mood and affect. His behavior is normal.  Nursing note and vitals reviewed.    ED Treatments / Results  DIAGNOSTIC STUDIES:  Oxygen Saturation is 97% on RA, normal by my interpretation.    COORDINATION OF CARE:  10:46 PM Discussed treatment plan with pt at bedside and pt agreed to plan.  Labs (all labs ordered are listed, but only abnormal results are displayed) Labs Reviewed - No data to display  EKG  EKG Interpretation None       Radiology No results found.  Procedures Procedures (including critical care time)  Medications Ordered in ED Medications - No data to display   Initial Impression / Assessment and Plan / ED Course  I have reviewed the triage vital signs and the nursing notes.  Pertinent labs & imaging results that were available during my care of the patient were reviewed by me and considered in my medical decision making (see chart for details).  Clinical Course    Pt presentation consistant with poison ivy infection. Patient is afebrile and non-toxic appearing in NAD. No facial involvement of rash. No oral lesions. No trismus. No stridor. Airway intact without compromise. Discussed contagiousness & home care. Treated in ED with topical steroid cream. Follow up with PCP. Strict return precautions discussed. Patient voiced understanding and is agreeable.      Final Clinical Impressions(s) / ED Diagnoses   Final diagnoses:  Poison ivy dermatitis    New Prescriptions Discharge Medication List as of 01/25/2016 11:06 PM    I personally performed the services described in this documentation, which was scribed in my presence. The recorded information has been reviewed and is accurate.     Lona Kettle, [place] 01/26/16 1610    Shaune Pollack, MD 01/26/16 1340

## 2016-01-25 NOTE — ED Triage Notes (Signed)
Patient presents with poison ivy on his arms  Stated he went to [facility] and was given a prescription and he stated he had to decide if he wanted to eat

## 2018-02-28 ENCOUNTER — Emergency Department (HOSPITAL_COMMUNITY): Payer: Worker's Compensation

## 2018-02-28 ENCOUNTER — Emergency Department (HOSPITAL_COMMUNITY)
Admission: EM | Admit: 2018-02-28 | Discharge: 2018-02-28 | Disposition: A | Discharge status: Home / Self Care | Payer: Worker's Compensation | Attending: Emergency Medicine | Admitting: Emergency Medicine

## 2018-02-28 ENCOUNTER — Encounter (HOSPITAL_COMMUNITY): Payer: Self-pay

## 2018-02-28 DIAGNOSIS — Y929 Unspecified place or not applicable: Secondary | ICD-10-CM | POA: Insufficient documentation

## 2018-02-28 DIAGNOSIS — S0990XA Unspecified injury of head, initial encounter: Secondary | ICD-10-CM | POA: Diagnosis present

## 2018-02-28 DIAGNOSIS — W208XXA Other cause of strike by thrown, projected or falling object, initial encounter: Secondary | ICD-10-CM | POA: Diagnosis not present

## 2018-02-28 DIAGNOSIS — Z79899 Other long term (current) drug therapy: Secondary | ICD-10-CM | POA: Diagnosis not present

## 2018-02-28 DIAGNOSIS — Y9389 Activity, other specified: Secondary | ICD-10-CM | POA: Diagnosis not present

## 2018-02-28 DIAGNOSIS — Y990 Civilian activity done for income or pay: Secondary | ICD-10-CM | POA: Diagnosis not present

## 2018-02-28 DIAGNOSIS — F17210 Nicotine dependence, cigarettes, uncomplicated: Secondary | ICD-10-CM | POA: Insufficient documentation

## 2018-02-28 LAB — RAPID URINE DRUG SCREEN, HOSP PERFORMED
Amphetamines: NOT DETECTED
BARBITURATES: NOT DETECTED
Benzodiazepines: NOT DETECTED
COCAINE: NOT DETECTED
OPIATES: NOT DETECTED
Tetrahydrocannabinol: NOT DETECTED

## 2018-02-28 NOTE — ED Provider Notes (Signed)
[facility]-EMERGENCY DEPT Provider Note   CSN: [id number] Arrival date & time: 02/28/18  1623     History   Chief Complaint Chief Complaint  Patient presents with  . Head Injury    HPI Justin Khan is a 47 y.o. male.  47 year old male with prior medical history as detailed below presents for evaluation following reported head injury.  Patient reports that he was at work when a plastic wheel fell off a room divider.  The wheel fell approximately 45 feet and struck him on the top of the scalp.  He denies LOC.  He denies neck pain.  He complains of a contusion to the top of his head.  He denies other injury.  The history is provided by the patient and medical records.  Head Injury   The incident occurred 3 to 5 hours ago. He came to the ER via walk-in. The injury mechanism was a direct blow. There was no loss of consciousness. There was no blood loss. The pain is at a severity of 0/10. The patient is experiencing no pain. The pain has been constant since the injury. He was found conscious by EMS personnel. He has tried nothing for the symptoms.    Past Medical History:  Diagnosis Date  . Bipolar 1 disorder (HCC)   . Chronic back pain   . Osteoarthritis   . PTSD (post-traumatic stress disorder)     There are no active problems to display for this patient.   Past Surgical History:  Procedure Laterality Date  . FRACTURE SURGERY          Home Medications    Prior to Admission medications   Medication Sig Start Date End Date Taking? Authorizing Provider  ibuprofen (ADVIL,MOTRIN) 600 MG tablet Take 1 tablet (600 mg total) by mouth every 6 (six) hours as needed for moderate pain. 01/08/16   Fayrene Helper, PA-C  methocarbamol (ROBAXIN) 500 MG tablet Take 2 tablets (1,000 mg total) by mouth every 8 (eight) hours as needed. 01/08/16   Fayrene Helper, PA-C  ondansetron (ZOFRAN) 4 MG tablet Take 1 tablet (4 mg total) by mouth every 6 (six) hours. 05/15/15    Tharon Aquas, PA    Family History History reviewed. No pertinent family history.  Social History Social History   Tobacco Use  . Smoking status: Current Every Day Smoker    Packs/day: 1.00    Types: Cigarettes  . Smokeless tobacco: Never Used  Substance Use Topics  . Alcohol use: Yes    Comment: occasional  . Drug use: No     Allergies   Patient has no known allergies.   Review of Systems Review of Systems  All other systems reviewed and are negative.    Physical Exam Updated Vital Signs BP 138/74 (BP Location: Left Arm)   Pulse 76   Temp 98 F (36.7 C) (Oral)   Resp 14   Ht 6\' 1"  (1.854 m)   Wt 101.2 kg   SpO2 99%   BMI 29.42 kg/m   Physical Exam  Constitutional: He is oriented to person, place, and time. He appears well-developed and well-nourished. No distress.  HENT:  Head: Normocephalic.  Mouth/Throat: Oropharynx is clear and moist.  Small contusion to the superior scalp - no break in skin   Eyes: Pupils are equal, round, and reactive to light. Conjunctivae and EOM are normal.  Neck: Normal range of motion. Neck supple.  Cardiovascular: Normal rate, regular rhythm and normal heart sounds.  Pulmonary/Chest: Effort normal and breath sounds normal. No respiratory distress.  Abdominal: Soft. He exhibits no distension. There is no tenderness.  Musculoskeletal: Normal range of motion. He exhibits no edema or deformity.  Neurological: He is alert and oriented to person, place, and time.  Skin: Skin is warm and dry.  Psychiatric: He has a normal mood and affect.  Nursing note and vitals reviewed.    ED Treatments / Results  Labs (all labs ordered are listed, but only abnormal results are displayed) Labs Reviewed  RAPID URINE DRUG SCREEN, HOSP PERFORMED    EKG None  Radiology Ct Head Wo Contrast  Result Date: 02/28/2018 CLINICAL DATA:  Large wall fell on patient today. Dizziness now subsided. EXAM: CT HEAD WITHOUT CONTRAST TECHNIQUE:  Contiguous axial images were obtained from the base of the skull through the vertex without intravenous contrast. COMPARISON:  01/29/2012 FINDINGS: Brain: No evidence of acute infarction, hemorrhage, hydrocephalus, extra-axial collection or mass lesion/mass effect. Vascular: No hyperdense vessel or unexpected calcification. Skull: Normal. Negative for fracture or focal lesion. Sinuses/Orbits: Mild-to-moderate ethmoid sinus mucosal thickening. The included maxillary and sphenoid sinuses are unremarkable. Mastoids are clear. Intact orbits and globes. Other: None. No significant calvarial soft tissue swelling. IMPRESSION: 1. No acute intracranial abnormality. 2. Ethmoid sinus mucosal thickening. Electronically Signed   By: Tollie Ethavid  Kwon M.D.   On: 02/28/2018 17:43    Procedures Procedures (including critical care time)  Medications Ordered in ED Medications - No data to display   Initial Impression / Assessment and Plan / ED Course  I have reviewed the triage vital signs and the nursing notes.  Pertinent labs & imaging results that were available during my care of the patient were reviewed by me and considered in my medical decision making (see chart for details).     MDM  Screen complete  Patient is presenting for evaluation of reported head injury.  Exam is not suggestive of significant traumatic injury.  CT imaging does not reveal significant acute pathology.  Patient appears to be stable for discharge.  Importance of close follow-up was stressed.  Strict return precautions given and understood.  Final Clinical Impressions(s) / ED Diagnoses   Final diagnoses:  Injury of head, initial encounter    ED Discharge Orders    None       Wynetta FinesMessick, Joee Iovine C, MD 02/28/18 [phone]

## 2018-02-28 NOTE — Discharge Instructions (Addendum)
Please return for any problem.  Follow-up with your regular care provider as instructed. °
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
## 2018-07-27 ENCOUNTER — Other Ambulatory Visit: Payer: Self-pay

## 2018-07-27 ENCOUNTER — Emergency Department (HOSPITAL_COMMUNITY): Payer: Commercial Managed Care - PPO

## 2018-07-27 ENCOUNTER — Emergency Department (HOSPITAL_COMMUNITY)
Admission: EM | Admit: 2018-07-27 | Discharge: 2018-07-27 | Disposition: A | Discharge status: Home / Self Care | Payer: Commercial Managed Care - PPO | Attending: Emergency Medicine | Admitting: Emergency Medicine

## 2018-07-27 ENCOUNTER — Encounter (HOSPITAL_COMMUNITY): Payer: Self-pay | Admitting: Emergency Medicine

## 2018-07-27 DIAGNOSIS — L03211 Cellulitis of face: Secondary | ICD-10-CM | POA: Diagnosis not present

## 2018-07-27 DIAGNOSIS — R220 Localized swelling, mass and lump, head: Secondary | ICD-10-CM | POA: Diagnosis present

## 2018-07-27 DIAGNOSIS — Z79899 Other long term (current) drug therapy: Secondary | ICD-10-CM | POA: Diagnosis not present

## 2018-07-27 DIAGNOSIS — F17210 Nicotine dependence, cigarettes, uncomplicated: Secondary | ICD-10-CM | POA: Diagnosis not present

## 2018-07-27 LAB — CBC WITH DIFFERENTIAL/PLATELET
Abs Immature Granulocytes: 0.08 10*3/uL — ABNORMAL HIGH (ref 0.00–0.07)
Basophils Absolute: 0.1 10*3/uL (ref 0.0–0.1)
Basophils Relative: 1 %
Eosinophils Absolute: 0.1 10*3/uL (ref 0.0–0.5)
Eosinophils Relative: 1 %
HCT: 43.9 % (ref 39.0–52.0)
Hemoglobin: 14.7 g/dL (ref 13.0–17.0)
Immature Granulocytes: 1 %
Lymphocytes Relative: 10 %
Lymphs Abs: 1.1 10*3/uL (ref 0.7–4.0)
MCH: 30.6 pg (ref 26.0–34.0)
MCHC: 33.5 g/dL (ref 30.0–36.0)
MCV: 91.5 fL (ref 80.0–100.0)
Monocytes Absolute: 1 10*3/uL (ref 0.1–1.0)
Monocytes Relative: 9 %
Neutro Abs: 8.3 10*3/uL — ABNORMAL HIGH (ref 1.7–7.7)
Neutrophils Relative %: 78 %
Platelets: 180 10*3/uL (ref 150–400)
RBC: 4.8 MIL/uL (ref 4.22–5.81)
RDW: 12.7 % (ref 11.5–15.5)
WBC: 10.6 10*3/uL — ABNORMAL HIGH (ref 4.0–10.5)
nRBC: 0 % (ref 0.0–0.2)

## 2018-07-27 LAB — BASIC METABOLIC PANEL
Anion gap: 9 (ref 5–15)
BUN: 16 mg/dL (ref 6–20)
CO2: 25 mmol/L (ref 22–32)
Calcium: 9.5 mg/dL (ref 8.9–10.3)
Chloride: 104 mmol/L (ref 98–111)
Creatinine, Ser: 1.03 mg/dL (ref 0.61–1.24)
GFR calc Af Amer: >60 mL/min (ref >60)
GFR calc non Af Amer: >60 mL/min (ref >60)
Glucose, Bld: 124 mg/dL — ABNORMAL HIGH (ref 70–99)
Potassium: 3.9 mmol/L (ref 3.5–5.1)
Sodium: 138 mmol/L (ref 135–145)

## 2018-07-27 MED ORDER — FENTANYL CITRATE (PF) 100 MCG/2ML IJ SOLN
50.0000 ug | Freq: Once | INTRAMUSCULAR | Status: AC
  Administered 2018-07-27: 50 ug via INTRAVENOUS
  Filled 2018-07-27: qty 2

## 2018-07-27 MED ORDER — CLINDAMYCIN PHOSPHATE 600 MG/50ML IV SOLN
600.0000 mg | Freq: Once | INTRAVENOUS | Status: AC
  Administered 2018-07-27: 600 mg via INTRAVENOUS
  Filled 2018-07-27: qty 50

## 2018-07-27 MED ORDER — IOHEXOL 300 MG/ML  SOLN
75.0000 mL | Freq: Once | INTRAMUSCULAR | Status: AC | PRN
  Administered 2018-07-27: 75 mL via INTRAVENOUS

## 2018-07-27 MED ORDER — SODIUM CHLORIDE 0.9 % IV BOLUS
500.0000 mL | Freq: Once | INTRAVENOUS | Status: AC
  Administered 2018-07-27: 500 mL via INTRAVENOUS

## 2018-07-27 MED ORDER — CLINDAMYCIN HCL 150 MG PO CAPS: 300.0000 mg | ORAL_CAPSULE | Freq: Three times a day (TID) | ORAL | 0 refills | Status: DC

## 2018-07-27 MED ORDER — CLINDAMYCIN HCL 150 MG PO CAPS: 300.0000 mg | ORAL_CAPSULE | Freq: Three times a day (TID) | ORAL | 0 refills | Status: AC

## 2018-07-27 MED ORDER — SODIUM CHLORIDE (PF) 0.9 % IJ SOLN
INTRAMUSCULAR | Status: AC
  Filled 2018-07-27: qty 50

## 2018-07-27 NOTE — Discharge Instructions (Signed)
In addition to the prescribed antibiotics, and your current medication regimen, please use warm compresses for additional symptomatic relief. Return here for concerning changes otherwise please follow-up with our ENT physician.

## 2018-07-27 NOTE — ED Provider Notes (Addendum)
[facility]-EMERGENCY DEPT Provider Note   CSN: [id number] Arrival date & time: 07/27/18  0846    History   Chief Complaint Chief Complaint  Patient presents with  . Facial Swelling    HPI Justin Khan is a 48 y.o. male.     HPI Patient presents with concern of facial lesion, nausea, chills. Onset was a few days ago, initially innocuous, with slight swelling in the left nasolabial area. Patient initially thought this may be a pimple, and with application of pressure produced some pus, blood. Over the interval days, the lesion has enlarged, and though he still can express some purulent material, with no chills, nausea, increasing erythema, he presents for evaluation. Pain is severe, though the patient takes his chronic pain medication regularly, has minimal relief.  Past Medical History:  Diagnosis Date  . Bipolar 1 disorder (HCC)   . Chronic back pain   . Osteoarthritis   . PTSD (post-traumatic stress disorder)     There are no active problems to display for this patient.   Past Surgical History:  Procedure Laterality Date  . FRACTURE SURGERY          Home Medications    Prior to Admission medications   Medication Sig Start Date End Date Taking? Authorizing Provider  gabapentin (NEURONTIN) 300 MG capsule Take 300 mg by mouth 2 (two) times daily. 05/21/18   [provider]  ibuprofen (ADVIL,MOTRIN) 600 MG tablet Take 1 tablet (600 mg total) by mouth every 6 (six) hours as needed for moderate pain. 01/08/16   Fayrene Helper, PA-C  meloxicam (MOBIC) 15 MG tablet Take 15 mg by mouth daily. 05/21/18   [provider]  methocarbamol (ROBAXIN) 500 MG tablet Take 2 tablets (1,000 mg total) by mouth every 8 (eight) hours as needed. 01/08/16   Fayrene Helper, PA-C  NALOCET 2.5-300 MG per tablet Take 1-2 tablets by mouth every 6 (six) hours as needed for pain. 05/21/18   [provider]  ondansetron (ZOFRAN) 4 MG tablet Take 1 tablet  (4 mg total) by mouth every 6 (six) hours. 05/15/15   Tharon Aquas, PA  tiZANidine (ZANAFLEX) 4 MG tablet Take 4 mg by mouth 3 (three) times daily as needed for muscle pain. 05/21/18   [provider]    Family History No family history on file.  Social History Social History   Tobacco Use  . Smoking status: Current Every Day Smoker    Packs/day: 1.00    Types: Cigarettes  . Smokeless tobacco: Never Used  Substance Use Topics  . Alcohol use: Yes    Comment: occasional  . Drug use: No     Allergies   Patient has no known allergies.   Review of Systems Review of Systems  Constitutional:       Per HPI, otherwise negative  HENT:       Per HPI, otherwise negative  Respiratory:       Per HPI, otherwise negative  Cardiovascular:       Per HPI, otherwise negative  Gastrointestinal: Positive for nausea. Negative for abdominal pain and vomiting.  Endocrine:       Negative aside from HPI  Genitourinary:       Neg aside from HPI   Musculoskeletal:       Per HPI, otherwise negative  Skin: Negative.   Neurological: Negative for syncope and weakness.     Physical Exam Updated Vital Signs BP (!) 148/98 (BP Location: Right Arm)  Pulse 87   Temp 98.3 F (36.8 C) (Oral)   Resp 15   SpO2 95%   Physical Exam Vitals signs and nursing note reviewed.  Constitutional:      General: He is not in acute distress.    Appearance: He is well-developed.  HENT:     Head: Atraumatic.     Nose:   Eyes:     Conjunctiva/sclera: Conjunctivae normal.  Cardiovascular:     Rate and Rhythm: Normal rate and regular rhythm.  Pulmonary:     Effort: Pulmonary effort is normal. No respiratory distress.     Breath sounds: No stridor.  Abdominal:     General: There is no distension.  Skin:    General: Skin is warm and dry.  Neurological:     Mental Status: He is alert and oriented to person, place, and time.      ED Treatments / Results  Labs (all labs ordered are  listed, but only abnormal results are displayed) Labs Reviewed  BASIC METABOLIC PANEL - Abnormal; Notable for the following components:      Result Value   Glucose, Bld 124 (*)    All other components within normal limits  CBC WITH DIFFERENTIAL/PLATELET - Abnormal; Notable for the following components:   WBC 10.6 (*)    Neutro Abs 8.3 (*)    Abs Immature Granulocytes 0.08 (*)    All other components within normal limits    Radiology Ct Maxillofacial W Contrast  Result Date: 07/27/2018 CLINICAL DATA:  Progressive facial inflammatory change. EXAM: CT MAXILLOFACIAL WITH CONTRAST TECHNIQUE: Multidetector CT imaging of the maxillofacial structures was performed with intravenous contrast. Multiplanar CT image reconstructions were also generated. CONTRAST:  75mL OMNIPAQUE IOHEXOL 300 MG/ML  SOLN COMPARISON:  None. FINDINGS: Osseous: No evidence of fracture or osteomyelitis. Orbits: Normal Sinuses: Retention cyst at the maxillary sinus floors, left more than right. Soft tissues: Superficial cellulitis of the nose and cheek, more to the left of midline, with swelling of the upper lip. Indistinct low-density region at the base of the nose and upper lip to the left of midline consistent with at least advanced phlegmonous inflammation and possibly early pus accumulation. Limited intracranial: Negative Dental: Advanced maxillary dental decay and periodontal disease. Numerous residual root fragments. IMPRESSION: Facial cellulitis affecting the inferior aspect of the nose, left cheek and upper lip. Indistinct low density region at the base of the nose and upper lip to the left of midline consistent with at least phlegmonous inflammation, and possibly early abscess formation. Electronically Signed   By: Paulina Fusi M.D.   On: 07/27/2018 10:55    Procedures Procedures (including critical care time)  Medications Ordered in ED Medications  sodium chloride (PF) 0.9 % injection (has no administration in time  range)  sodium chloride 0.9 % bolus 500 mL (0 mLs Intravenous Stopped 07/27/18 1025)  fentaNYL (SUBLIMAZE) injection 50 mcg (50 mcg Intravenous Given 07/27/18 0939)  clindamycin (CLEOCIN) IVPB 600 mg (0 mg Intravenous Stopped 07/27/18 1025)  iohexol (OMNIPAQUE) 300 MG/ML solution 75 mL (75 mLs Intravenous Contrast Given 07/27/18 1027)     Initial Impression / Assessment and Plan / ED Course  I have reviewed the triage vital signs and the nursing notes.  Pertinent labs & imaging results that were available during my care of the patient were reviewed by me and considered in my medical decision making (see chart for details).        11:15 AM On repeat exam patient is awake, alert,  sitting upright, is hemodynamically unremarkable. I reviewed the findings with the patient, including CT evidence for facial cellulitis, no drainable abscess. Patient's labs consistent with this, with mild leukocytosis.  He is afebrile, has no difficulty with swallowing, speaking, breathing, low suspicion for bacteremia, sepsis. He has received initial antibiotics via IV, will transition to oral antibiotics, will use warm compresses, will follow-up with ENT as needed.  Final Clinical Impressions(s) / ED Diagnoses   Final diagnoses:  Facial cellulitis    ED Discharge Orders         Ordered    clindamycin (CLEOCIN) 150 MG capsule  3 times daily,   Status:  Discontinued     07/27/18 1117    clindamycin (CLEOCIN) 150 MG capsule  3 times daily     07/27/18 1133           Gerhard MunchLockwood, Seon Gaertner, MD 07/27/18 1148    Gerhard MunchLockwood, Rykar Lebleu, MD 07/27/18 1148

## 2018-07-27 NOTE — ED Triage Notes (Signed)
Pt reports had pimple on face that popped last week and drained. Over the past week, patient reports that swelling has gotten worse and now "wormy like stuff coming out of it now".

## 2018-07-27 NOTE — ED Notes (Addendum)
Patient transported to CT. PT ambulated to CT w/ CT staff.

## 2020-01-05 IMAGING — CT CT MAXILLOFACIAL WITH CONTRAST
3 of 4 series · 16 of 47 positions shown, 19 images · IV contrast (omnipaque)
Comparison: None.

CLINICAL DATA: Progressive facial inflammatory change.

EXAM:
CT MAXILLOFACIAL WITH CONTRAST
TECHNIQUE: Multidetector CT imaging of the maxillofacial structures was
performed with intravenous contrast. Multiplanar CT image
reconstructions were also generated.
CONTRAST:  75mL OMNIPAQUE IOHEXOL 300 MG/ML  SOLN

[Series 3: max soft · axial · 0.37mm/px · z∈[-186,-32]mm · 10 of 91 slices shown, 13 images]
[im 7/91  brain]
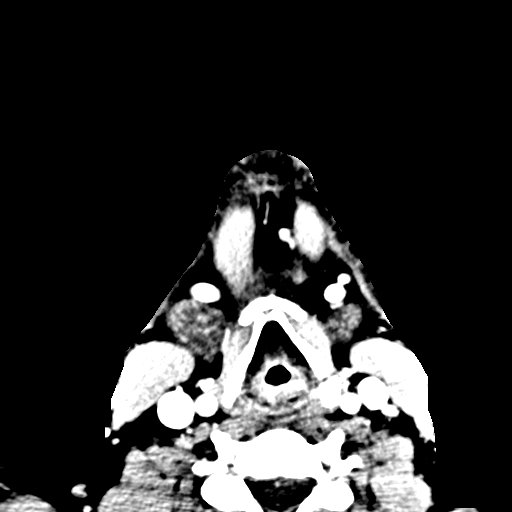
[im 7/91  bone]
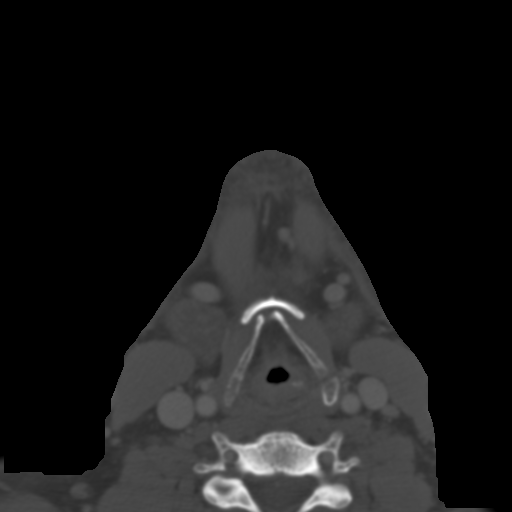
[im 16/91  bone]
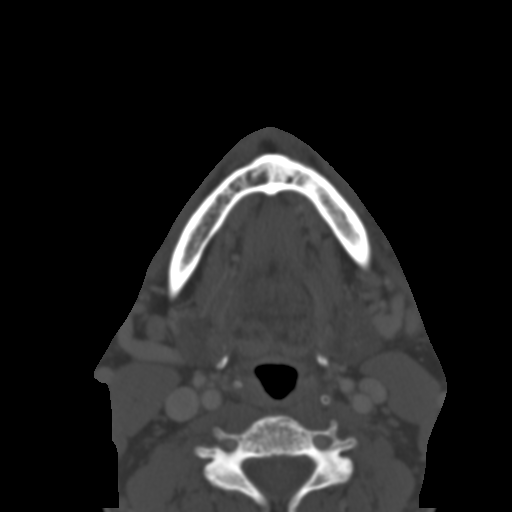
[im 25/91  bone]
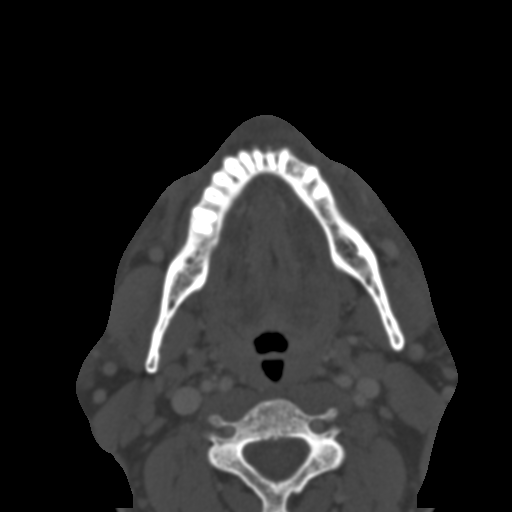
[im 32/91  bone]
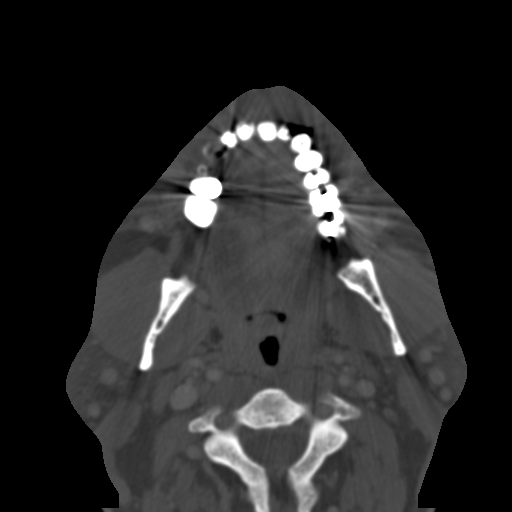
[im 41/91  brain]
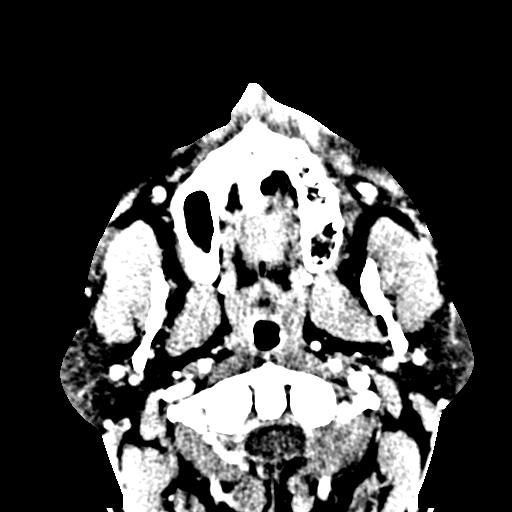
[im 41/91  bone]
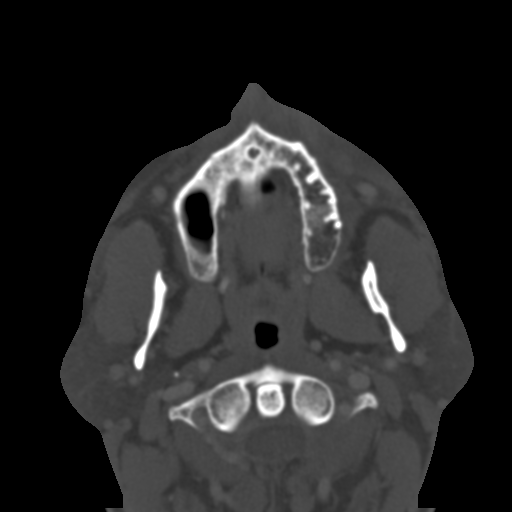
[im 50/91  bone]
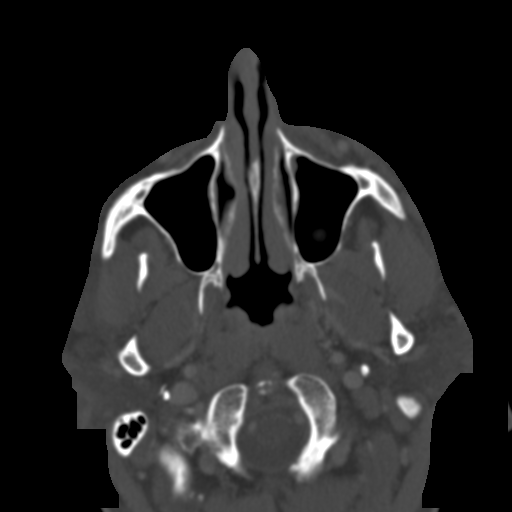
[im 59/91  bone]
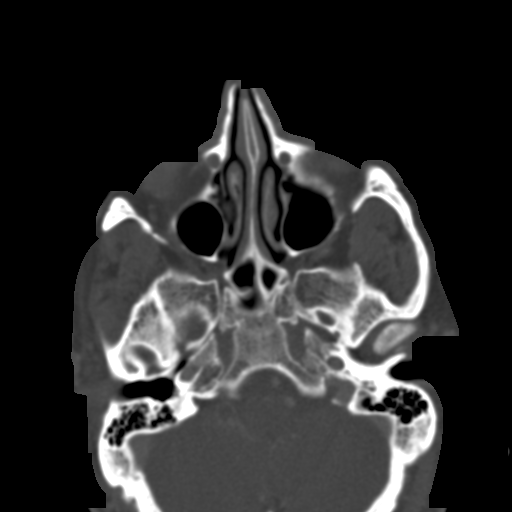
[im 69/91  bone]
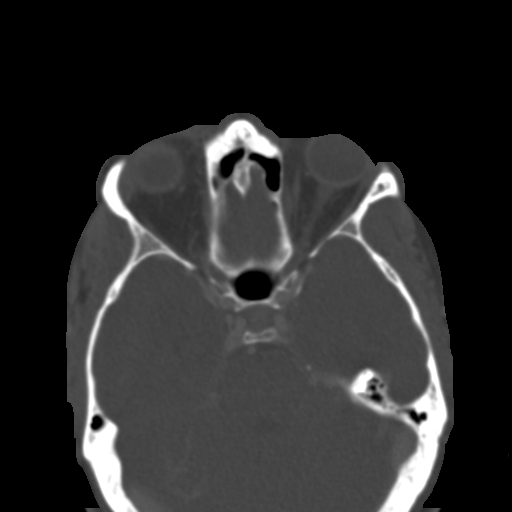
[im 75/91  brain]
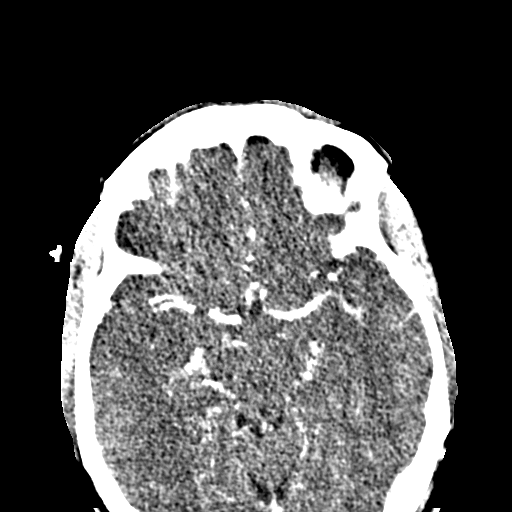
[im 75/91  bone]
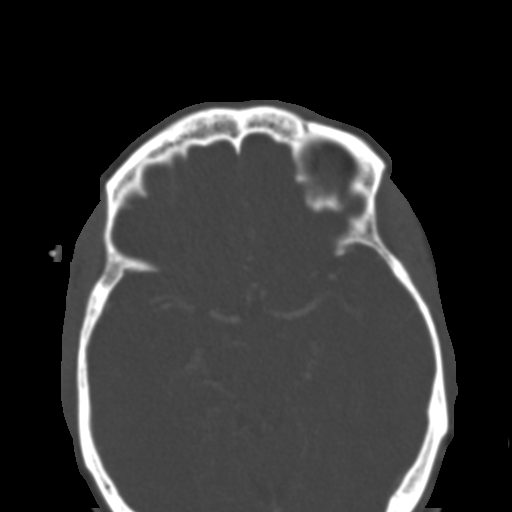
[im 84/91  bone]
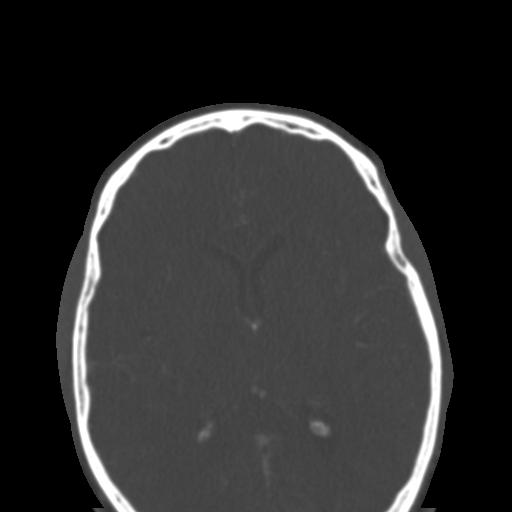

[Series 8: sagittal soft · sagittal · 0.39mm/px · 3 of 100 slices shown]
[im 34/100  bone]
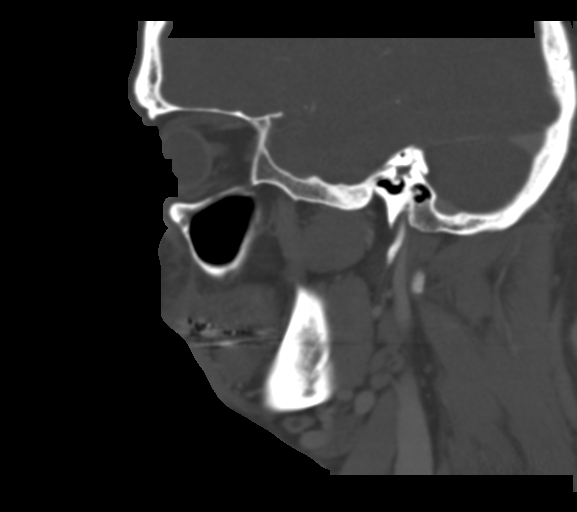
[im 50/100  bone]
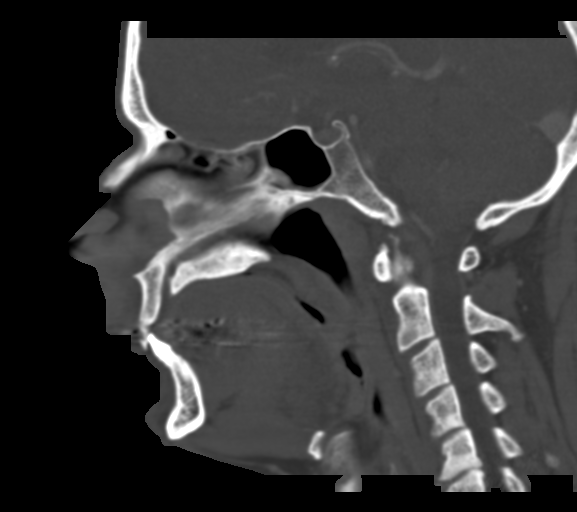
[im 67/100  bone]
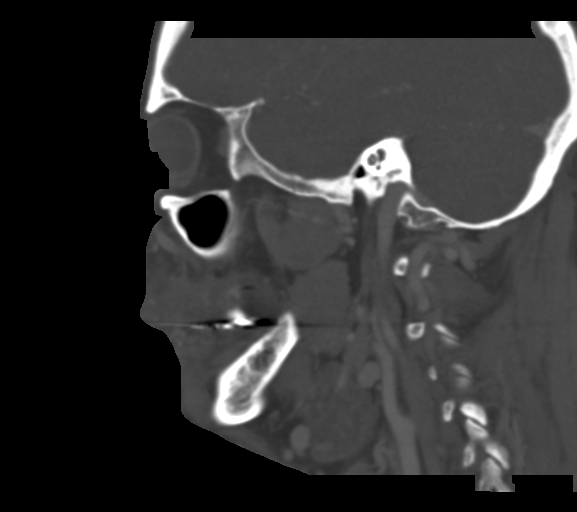

[Series 9: coronal bone · coronal · 0.39mm/px · 3 of 96 slices shown]
[im 24/96  bone]
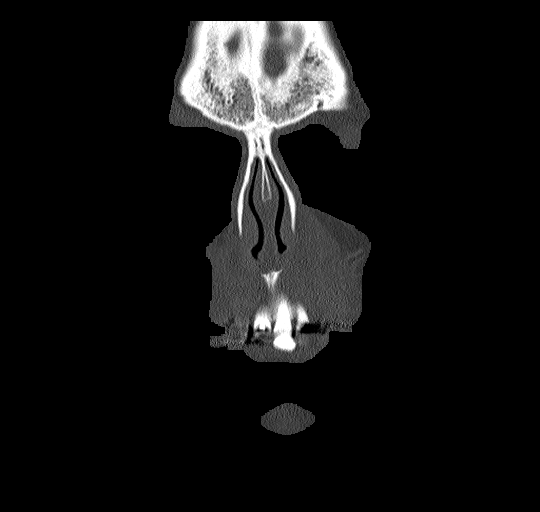
[im 48/96  bone]
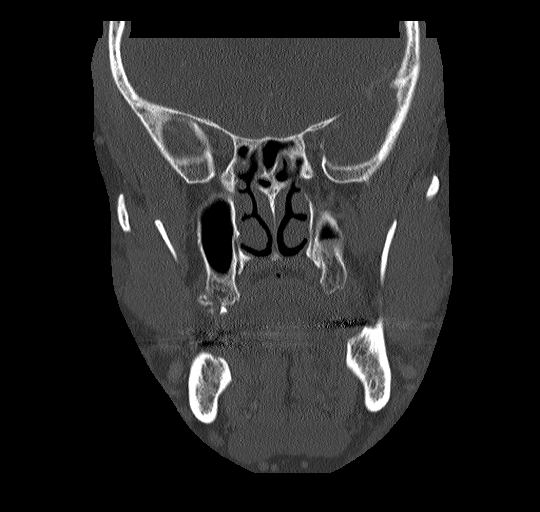
[im 72/96  bone]
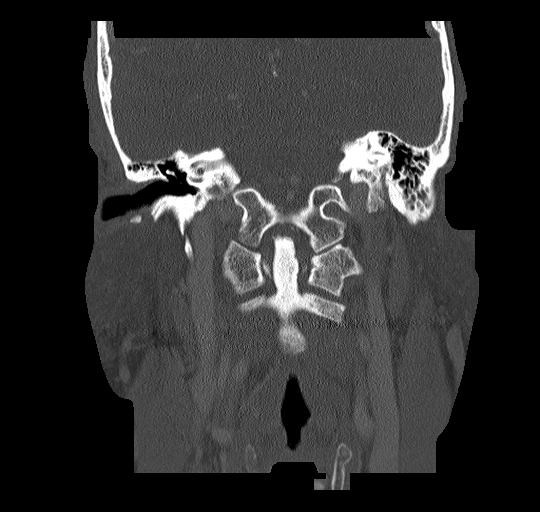

[16 of 47 positions shown; findings below may reference images not displayed]

FINDINGS: Osseous: No evidence of fracture or osteomyelitis.

Orbits: Normal

Sinuses: Retention cyst at the maxillary sinus floors, left more
than right.

Soft tissues: Superficial cellulitis of the nose and cheek, more to
the left of midline, with swelling of the upper lip. Indistinct
low-density region at the base of the nose and upper lip to the left
of midline consistent with at least advanced phlegmonous
inflammation and possibly early pus accumulation.

Limited intracranial: Negative

Dental: Advanced maxillary dental decay and periodontal disease.
Numerous residual root fragments.
IMPRESSION: Facial cellulitis affecting the inferior aspect of the nose, left
cheek and upper lip. Indistinct low density region at the base of
the nose and upper lip to the left of midline consistent with at
least phlegmonous inflammation, and possibly early abscess
formation.

## 2021-05-23 ENCOUNTER — Emergency Department (HOSPITAL_COMMUNITY): Payer: Commercial Managed Care - PPO

## 2021-05-23 ENCOUNTER — Other Ambulatory Visit: Payer: Self-pay

## 2021-05-23 ENCOUNTER — Emergency Department (HOSPITAL_COMMUNITY)
Admission: EM | Admit: 2021-05-23 | Discharge: 2021-05-23 | Disposition: A | Discharge status: Home / Self Care | Payer: Commercial Managed Care - PPO | Attending: Emergency Medicine | Admitting: Emergency Medicine

## 2021-05-23 ENCOUNTER — Encounter (HOSPITAL_COMMUNITY): Payer: Self-pay | Admitting: *Deleted

## 2021-05-23 DIAGNOSIS — H81399 Other peripheral vertigo, unspecified ear: Secondary | ICD-10-CM | POA: Diagnosis not present

## 2021-05-23 DIAGNOSIS — R000 Tachycardia, unspecified: Secondary | ICD-10-CM | POA: Insufficient documentation

## 2021-05-23 DIAGNOSIS — R7309 Other abnormal glucose: Secondary | ICD-10-CM | POA: Insufficient documentation

## 2021-05-23 DIAGNOSIS — R42 Dizziness and giddiness: Secondary | ICD-10-CM | POA: Diagnosis present

## 2021-05-23 DIAGNOSIS — R0789 Other chest pain: Secondary | ICD-10-CM | POA: Diagnosis not present

## 2021-05-23 DIAGNOSIS — R0602 Shortness of breath: Secondary | ICD-10-CM | POA: Insufficient documentation

## 2021-05-23 DIAGNOSIS — R110 Nausea: Secondary | ICD-10-CM | POA: Diagnosis not present

## 2021-05-23 LAB — URINALYSIS, ROUTINE W REFLEX MICROSCOPIC
Bilirubin Urine: NEGATIVE
Glucose, UA: 50 mg/dL — AB
Hgb urine dipstick: NEGATIVE
Ketones, ur: 5 mg/dL — AB
Leukocytes,Ua: NEGATIVE
Nitrite: NEGATIVE
Protein, ur: NEGATIVE mg/dL
Specific Gravity, Urine: 1.016 (ref 1.005–1.030)
pH: 5 (ref 5.0–8.0)

## 2021-05-23 LAB — BASIC METABOLIC PANEL
Anion gap: 10 (ref 5–15)
BUN: 16 mg/dL (ref 6–20)
CO2: 20 mmol/L — ABNORMAL LOW (ref 22–32)
Calcium: 8.9 mg/dL (ref 8.9–10.3)
Chloride: 109 mmol/L (ref 98–111)
Creatinine, Ser: 0.96 mg/dL (ref 0.61–1.24)
GFR, Estimated: >60 mL/min (ref >60)
Glucose, Bld: 202 mg/dL — ABNORMAL HIGH (ref 70–99)
Potassium: 4 mmol/L (ref 3.5–5.1)
Sodium: 139 mmol/L (ref 135–145)

## 2021-05-23 LAB — CBC
HCT: 44.4 % (ref 39.0–52.0)
Hemoglobin: 15 g/dL (ref 13.0–17.0)
MCH: 30.7 pg (ref 26.0–34.0)
MCHC: 33.8 g/dL (ref 30.0–36.0)
MCV: 90.8 fL (ref 80.0–100.0)
Platelets: 197 10*3/uL (ref 150–400)
RBC: 4.89 MIL/uL (ref 4.22–5.81)
RDW: 13.3 % (ref 11.5–15.5)
WBC: 5 10*3/uL (ref 4.0–10.5)
nRBC: 0 % (ref 0.0–0.2)

## 2021-05-23 LAB — TROPONIN I (HIGH SENSITIVITY): Troponin I (High Sensitivity): 5 ng/L (ref <18)

## 2021-05-23 LAB — BRAIN NATRIURETIC PEPTIDE: B Natriuretic Peptide: 11.7 pg/mL (ref 0.0–100.0)

## 2021-05-23 MED ORDER — MECLIZINE HCL 25 MG PO TABS
25.0000 mg | ORAL_TABLET | Freq: Once | ORAL | Status: AC
  Administered 2021-05-23: 25 mg via ORAL
  Filled 2021-05-23: qty 1

## 2021-05-23 MED ORDER — LACTATED RINGERS IV BOLUS
1000.0000 mL | Freq: Once | INTRAVENOUS | Status: AC
  Administered 2021-05-23: 1000 mL via INTRAVENOUS

## 2021-05-23 MED ORDER — ONDANSETRON HCL 4 MG/2ML IJ SOLN
4.0000 mg | Freq: Once | INTRAMUSCULAR | Status: AC
  Administered 2021-05-23: 4 mg via INTRAVENOUS
  Filled 2021-05-23: qty 2

## 2021-05-23 MED ORDER — ACETAMINOPHEN 325 MG PO TABS
650.0000 mg | ORAL_TABLET | Freq: Once | ORAL | Status: DC
  Filled 2021-05-23: qty 2

## 2021-05-23 MED ORDER — MECLIZINE HCL 25 MG PO TABS: 25.0000 mg | ORAL_TABLET | Freq: Three times a day (TID) | ORAL | 0 refills | Status: AC | PRN

## 2021-05-23 NOTE — Discharge Instructions (Addendum)
Your blood sugar was high today (202).  You may have developed diabetes.  Please follow-up with your primary care provider to recheck your blood sugar and also to check your hemoglobin A1c (a test that measures the average of what your blood sugars have been over the last 3 months).  If the tests show that you do have diabetes, you will need to be started on medication for it.

## 2021-05-23 NOTE — ED Provider Notes (Signed)
[facility] DEPT Provider Note   CSN: [id number] Arrival date & time: 05/23/21  [id number]     History  Chief Complaint  Patient presents with   Dizziness    Justin Khan is a 51 y.o. male.  The history is provided by the patient.  Dizziness He has history of bipolar disorder and comes in because of dizziness and shortness of breath.  Symptoms have been present for the last 2 days and have been stable.  Dizziness is described as a spinning sensation and will come on intermittently.  There is some associated nausea.  Dizziness is mainly occurring when he stands up.  He had 1 episode of right-sided chest tightness yesterday afternoon.  On a couple occasions, he felt like he might pass out.  He has never had similar symptoms in the past.  He denies exertional dyspnea and denies orthopnea.  He denies cough or fever.  He denies hearing loss, ear pain, tinnitus.   Home Medications Prior to Admission medications   Medication Sig Start Date End Date Taking? Authorizing Provider  gabapentin (NEURONTIN) 300 MG capsule Take 300 mg by mouth 2 (two) times daily. 05/21/18   [provider]  ibuprofen (ADVIL,MOTRIN) 600 MG tablet Take 1 tablet (600 mg total) by mouth every 6 (six) hours as needed for moderate pain. 01/08/16   Domenic Moras, PA-C  meloxicam (MOBIC) 15 MG tablet Take 15 mg by mouth daily. 05/21/18   [provider]  methocarbamol (ROBAXIN) 500 MG tablet Take 2 tablets (1,000 mg total) by mouth every 8 (eight) hours as needed. 01/08/16   Domenic Moras, PA-C  NALOCET 2.5-300 MG per tablet Take 1-2 tablets by mouth every 6 (six) hours as needed for pain. 05/21/18   [provider]  ondansetron (ZOFRAN) 4 MG tablet Take 1 tablet (4 mg total) by mouth every 6 (six) hours. 05/15/15   Konrad Felix, PA  tiZANidine (ZANAFLEX) 4 MG tablet Take 4 mg by mouth 3 (three) times daily as needed for muscle pain. 05/21/18   [provider]       Allergies    Patient has no known allergies.    Review of Systems   Review of Systems  Neurological:  Positive for dizziness.  All other systems reviewed and are negative.  Physical Exam Updated Vital Signs BP (!) 150/95 (BP Location: Right Arm)    Pulse (!) 108    Temp 97.9 F (36.6 C) (Oral)    Resp (!) 22    Ht 6\' 1"  (1.854 m)    Wt 108.9 kg    SpO2 94%    BMI 31.66 kg/m  Physical Exam Vitals and nursing note reviewed.  51 year old male, resting comfortably and in no acute distress. Vital signs are significant for elevated heart rate, respiratory rate, blood pressure. Oxygen saturation is 94%, which is normal. Head is normocephalic and atraumatic. PERRLA, EOMI. Oropharynx is clear. Neck is nontender and supple without adenopathy or JVD. Back is nontender and there is no CVA tenderness. Lungs are clear without rales, wheezes, or rhonchi. Chest is nontender. Heart has regular rate and rhythm without murmur. Abdomen is soft, flat, nontender. Extremities have no cyanosis or edema, full range of motion is present. Skin is warm and dry without rash. Neurologic: Mental status is normal, cranial nerves are intact, moves all extremities equally.  There is no nystagmus.  Dizziness is reproduced by passive head movement.  ED Results / Procedures / Treatments   Labs (  all labs ordered are listed, but only abnormal results are displayed) Labs Reviewed  BASIC METABOLIC PANEL - Abnormal; Notable for the following components:      Result Value   CO2 20 (*)    Glucose, Bld 202 (*)    All other components within normal limits  CBC  BRAIN NATRIURETIC PEPTIDE  URINALYSIS, ROUTINE W REFLEX MICROSCOPIC  TROPONIN I (HIGH SENSITIVITY)    EKG EKG Interpretation  Date/Time:  Sunday May 23 2021 04:27:44 EST Ventricular Rate:  121 PR Interval:  149 QRS Duration: 116 QT Interval:  317 QTC Calculation: 450 R Axis:   124 Text Interpretation: Sinus tachycardia RBBB and LPFB ST elev,  probable normal early repol pattern No old tracing to compare Confirmed by Trapper Meech ([id number]) on 05/23/2021 4:55:22 AM  Radiology DG Chest 2 View  Result Date: 05/23/2021 CLINICAL DATA:  Shortness of breath EXAM: CHEST - 2 VIEW COMPARISON:  None. FINDINGS: Artifact from EKG leads. Normal heart size and mediastinal contours. No acute infiltrate or edema. No effusion or pneumothorax. No acute osseous findings. IMPRESSION: No active cardiopulmonary disease. Electronically Signed   By: Jonathan  Watts M.D.   On: 05/23/2021 05:01    Procedures Procedures    Medications Ordered in ED Medications  lactated ringers bolus 1,000 mL (has no administration in time range)  meclizine (ANTIVERT) tablet 25 mg (has no administration in time range)  ondansetron (ZOFRAN) injection 4 mg (has no administration in time range)    ED Course/ Medical Decision Making/ A&P                           Medical Decision Making Amount and/or Complexity of Data Reviewed Labs: ordered. Radiology: ordered.  Risk OTC drugs. Prescription drug management.   Dizziness which appears to be peripheral vertigo.  No red flags to suggest more serious pathology such as a central vertigo.  Dyspnea of uncertain cause.  Exam is benign and oxygen saturation on room air is normal.  Chest x-ray was obtained to rule out pneumonia and shows no acute process.  ECG shows sinus tachycardia.  Tachycardia is concerning for occult dehydration, will give IV fluids as well as check screening labs.  He will be given therapeutic trial of meclizine.  Because of episode of chest tightness, will check troponin to make sure there was not an occult myocardial infarction.  We will also check BNP to look for occult heart failure.  Old records are reviewed, and he has no relevant past visits.  Heart rate is down after IV fluids.  Labs are significant for elevated random glucose concerning for new onset diabetes.  BNP and troponin are normal.  This will  need to be followed as an outpatient.  Dizziness is markedly improved following a dose of meclizine, but is not complaining of a headache and is given a dose of acetaminophen.  He is discharged with prescription for meclizine and referred back to his primary care provider to follow-up his elevated random glucose.        Final Clinical Impression(s) / ED Diagnoses Final diagnoses:  Peripheral vertigo, unspecified laterality  Elevated random blood glucose level    Rx / DC Orders ED Discharge Orders          Ordered    meclizine (ANTIVERT) 25 MG tablet  3 times daily PRN        02 /12/23 0717  Delora Fuel, MD [id number] [phone]

## 2021-05-23 NOTE — ED Triage Notes (Signed)
Pt says that for several days he has not felt well. Shortness of breath, headache, and dizziness. Tonight, he felt like he was going to pass out.

## 2022-11-01 IMAGING — CR DG CHEST 2V
2 series · 2 of 2 positions shown · non-contrast
Comparison: None.

CLINICAL DATA: Shortness of breath

EXAM:
CHEST - 2 VIEW

[w chest lat]
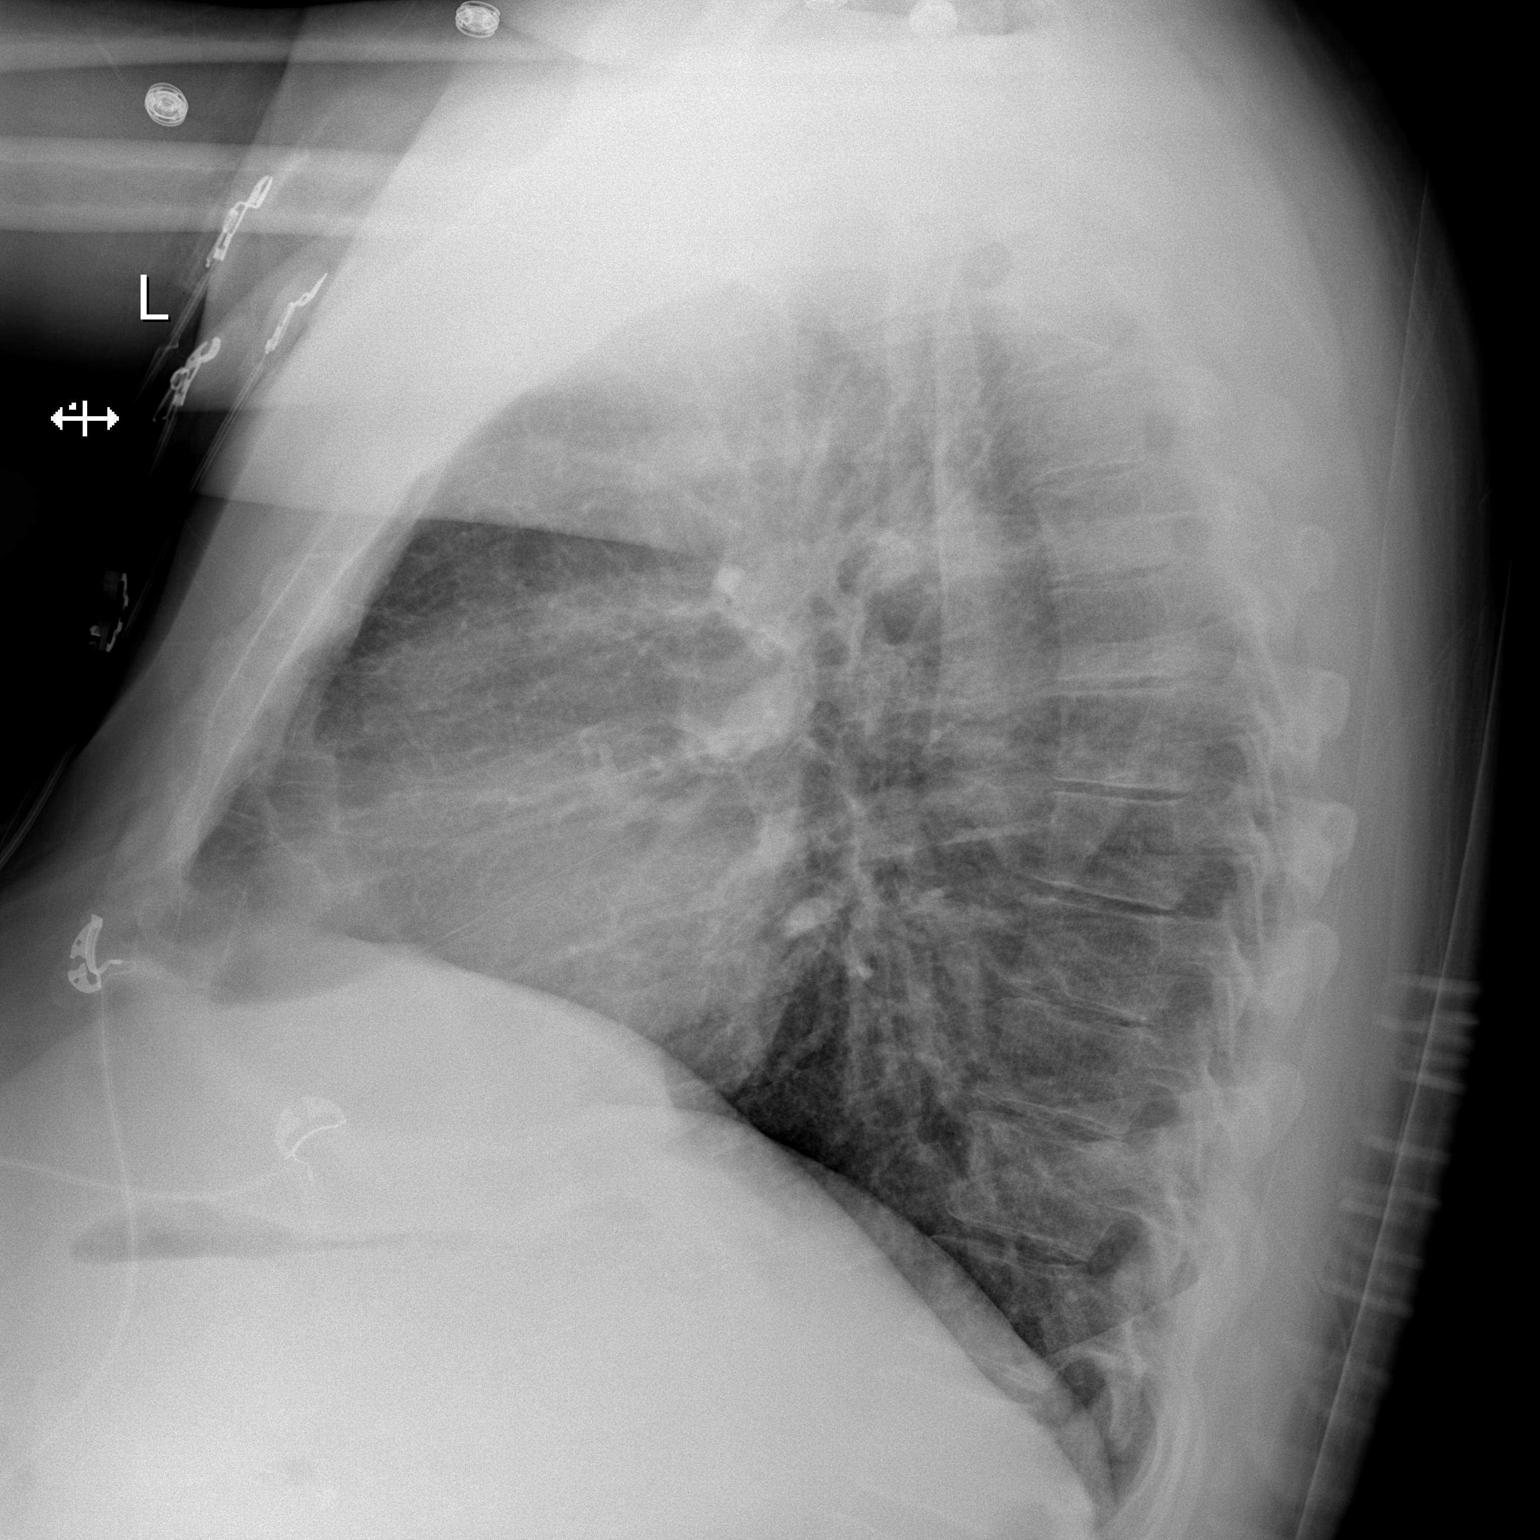

[x chest ap]
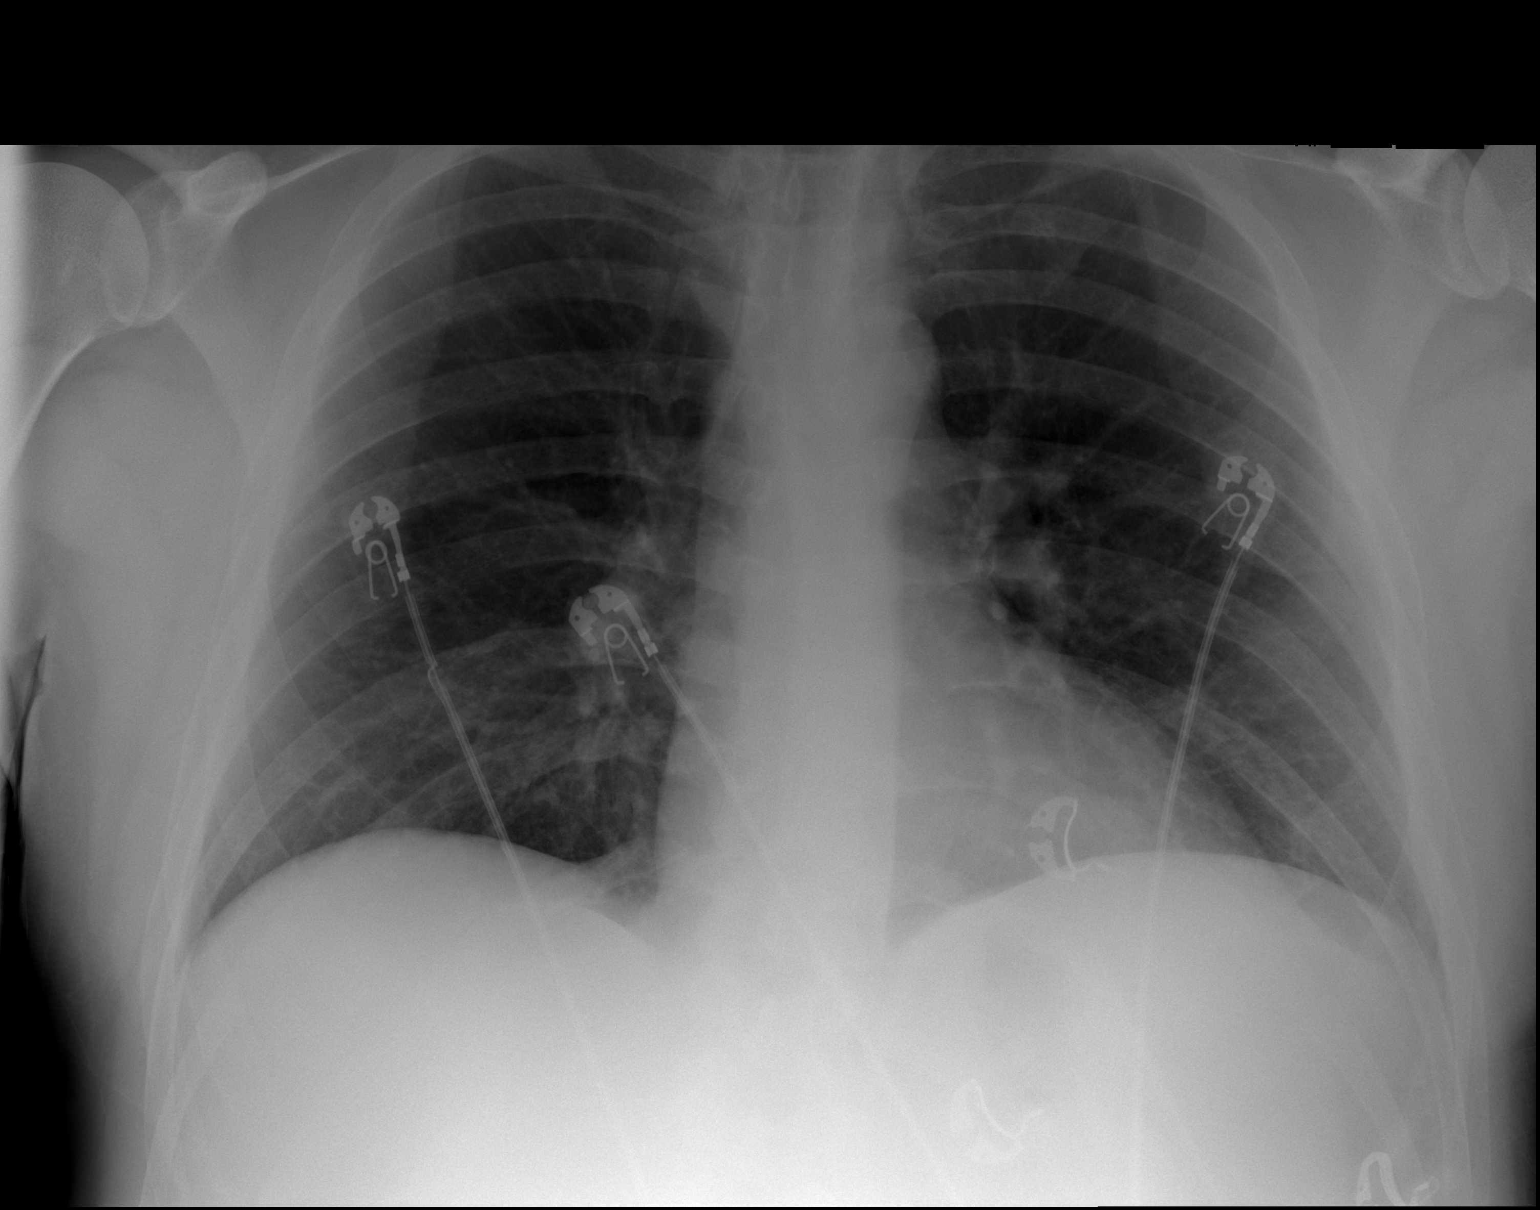

[2 of 2 positions shown; findings below may reference images not displayed]

FINDINGS: Artifact from EKG leads.

Normal heart size and mediastinal contours. No acute infiltrate or
edema. No effusion or pneumothorax. No acute osseous findings.
IMPRESSION: No active cardiopulmonary disease.
